# Patient Record
Sex: Female | Born: 1975 | Race: White | Hispanic: No | Smoking: Current every day smoker
Health system: Southern US, Community
[De-identification: ages and names within clinical notes are randomized; demographics above are authoritative.]

## PROBLEM LIST (undated history)

## (undated) ENCOUNTER — Ambulatory Visit (HOSPITAL_BASED_OUTPATIENT_CLINIC_OR_DEPARTMENT_OTHER): Admission: EM | Payer: Self-pay | Source: Home / Self Care

## (undated) ENCOUNTER — Ambulatory Visit (HOSPITAL_BASED_OUTPATIENT_CLINIC_OR_DEPARTMENT_OTHER): Admission: EM | Payer: Medicaid Other | Source: Home / Self Care

## (undated) DIAGNOSIS — E039 Hypothyroidism, unspecified: Secondary | ICD-10-CM

## (undated) DIAGNOSIS — Z8711 Personal history of peptic ulcer disease: Secondary | ICD-10-CM

## (undated) DIAGNOSIS — E785 Hyperlipidemia, unspecified: Secondary | ICD-10-CM

## (undated) DIAGNOSIS — Z720 Tobacco use: Secondary | ICD-10-CM

## (undated) DIAGNOSIS — M1990 Unspecified osteoarthritis, unspecified site: Secondary | ICD-10-CM

## (undated) DIAGNOSIS — J40 Bronchitis, not specified as acute or chronic: Secondary | ICD-10-CM

## (undated) DIAGNOSIS — M5090 Cervical disc disorder, unspecified, unspecified cervical region: Secondary | ICD-10-CM

## (undated) DIAGNOSIS — G479 Sleep disorder, unspecified: Secondary | ICD-10-CM

## (undated) DIAGNOSIS — I10 Essential (primary) hypertension: Secondary | ICD-10-CM

## (undated) DIAGNOSIS — K219 Gastro-esophageal reflux disease without esophagitis: Secondary | ICD-10-CM

## (undated) DIAGNOSIS — K589 Irritable bowel syndrome without diarrhea: Secondary | ICD-10-CM

## (undated) DIAGNOSIS — K279 Peptic ulcer, site unspecified, unspecified as acute or chronic, without hemorrhage or perforation: Secondary | ICD-10-CM

## (undated) DIAGNOSIS — G4700 Insomnia, unspecified: Secondary | ICD-10-CM

## (undated) DIAGNOSIS — J189 Pneumonia, unspecified organism: Secondary | ICD-10-CM

## (undated) DIAGNOSIS — D510 Vitamin B12 deficiency anemia due to intrinsic factor deficiency: Secondary | ICD-10-CM

## (undated) DIAGNOSIS — G8929 Other chronic pain: Secondary | ICD-10-CM

## (undated) DIAGNOSIS — L0292 Furuncle, unspecified: Secondary | ICD-10-CM

## (undated) DIAGNOSIS — R51 Headache: Secondary | ICD-10-CM

## (undated) DIAGNOSIS — R519 Headache, unspecified: Secondary | ICD-10-CM

## (undated) HISTORY — DX: Peptic ulcer, site unspecified, unspecified as acute or chronic, without hemorrhage or perforation: K27.9

## (undated) HISTORY — DX: Irritable bowel syndrome, unspecified: K58.9

## (undated) HISTORY — DX: Morbid (severe) obesity due to excess calories: E66.01

## (undated) HISTORY — PX: COLONOSCOPY: SHX174

## (undated) HISTORY — DX: Unspecified osteoarthritis, unspecified site: M19.90

## (undated) HISTORY — DX: Furuncle, unspecified: L02.92

## (undated) HISTORY — DX: Tobacco use: Z72.0

## (undated) HISTORY — DX: Personal history of peptic ulcer disease: Z87.11

## (undated) HISTORY — DX: Vitamin B12 deficiency anemia due to intrinsic factor deficiency: D51.0

## (undated) HISTORY — DX: Hyperlipidemia, unspecified: E78.5

## (undated) HISTORY — DX: Sleep disorder, unspecified: G47.9

## (undated) HISTORY — DX: Gastro-esophageal reflux disease without esophagitis: K21.9

## (undated) HISTORY — DX: Essential (primary) hypertension: I10

## (undated) HISTORY — DX: Other chronic pain: G89.29

## (undated) HISTORY — PX: CARPAL TUNNEL RELEASE: SHX101

## (undated) HISTORY — DX: Cervical disc disorder, unspecified, unspecified cervical region: M50.90

---

## 2003-05-23 ENCOUNTER — Ambulatory Visit (HOSPITAL_COMMUNITY): Admission: RE | Admit: 2003-05-23 | Discharge: 2003-05-23 | Payer: Self-pay | Admitting: *Deleted

## 2004-12-07 HISTORY — PX: TONSILLECTOMY: SUR1361

## 2005-05-12 ENCOUNTER — Emergency Department (HOSPITAL_COMMUNITY): Admission: EM | Admit: 2005-05-12 | Discharge: 2005-05-12 | Payer: Self-pay | Admitting: Family Medicine

## 2006-09-07 ENCOUNTER — Encounter: Admission: RE | Admit: 2006-09-07 | Discharge: 2006-09-07 | Payer: Self-pay | Admitting: Specialist

## 2008-05-10 ENCOUNTER — Emergency Department (HOSPITAL_COMMUNITY): Admission: EM | Admit: 2008-05-10 | Discharge: 2008-05-11 | Payer: Self-pay | Admitting: Emergency Medicine

## 2008-12-07 HISTORY — PX: MOUTH SURGERY: SHX715

## 2009-02-05 ENCOUNTER — Ambulatory Visit: Payer: Self-pay | Admitting: Internal Medicine

## 2009-02-05 DIAGNOSIS — K279 Peptic ulcer, site unspecified, unspecified as acute or chronic, without hemorrhage or perforation: Secondary | ICD-10-CM | POA: Insufficient documentation

## 2009-02-05 DIAGNOSIS — I10 Essential (primary) hypertension: Secondary | ICD-10-CM | POA: Insufficient documentation

## 2009-02-05 DIAGNOSIS — K219 Gastro-esophageal reflux disease without esophagitis: Secondary | ICD-10-CM

## 2009-02-07 ENCOUNTER — Telehealth: Payer: Self-pay | Admitting: Internal Medicine

## 2009-02-08 LAB — CONVERTED CEMR LAB
AST: 24 units/L (ref 0–37)
Albumin: 3.6 g/dL (ref 3.5–5.2)
Alkaline Phosphatase: 118 units/L — ABNORMAL HIGH (ref 39–117)
BUN: 9 mg/dL (ref 6–23)
Basophils Relative: 0.8 % (ref 0.0–3.0)
Chloride: 107 meq/L (ref 96–112)
Cholesterol: 216 mg/dL (ref 0–200)
Creatinine, Ser: 0.6 mg/dL (ref 0.4–1.2)
Eosinophils Absolute: 0.2 10*3/uL (ref 0.0–0.7)
Eosinophils Relative: 2.5 % (ref 0.0–5.0)
Glucose, Bld: 92 mg/dL (ref 70–99)
HCT: 45.7 % (ref 36.0–46.0)
MCV: 91.4 fL (ref 78.0–100.0)
Monocytes Absolute: 0.6 10*3/uL (ref 0.1–1.0)
Monocytes Relative: 5.8 % (ref 3.0–12.0)
Neutrophils Relative %: 52.6 % (ref 43.0–77.0)
Platelets: 273 10*3/uL (ref 150–400)
Potassium: 3.8 meq/L (ref 3.5–5.1)
RBC: 5 M/uL (ref 3.87–5.11)
Total Protein: 7.2 g/dL (ref 6.0–8.3)
WBC: 9.5 10*3/uL (ref 4.5–10.5)

## 2009-02-15 ENCOUNTER — Ambulatory Visit: Payer: Self-pay | Admitting: Internal Medicine

## 2009-02-15 DIAGNOSIS — L0293 Carbuncle, unspecified: Secondary | ICD-10-CM

## 2009-02-15 DIAGNOSIS — L0292 Furuncle, unspecified: Secondary | ICD-10-CM | POA: Insufficient documentation

## 2009-03-07 ENCOUNTER — Ambulatory Visit: Payer: Self-pay | Admitting: Internal Medicine

## 2009-03-14 ENCOUNTER — Encounter: Payer: Self-pay | Admitting: Internal Medicine

## 2009-03-28 ENCOUNTER — Ambulatory Visit: Payer: Self-pay | Admitting: Internal Medicine

## 2009-06-24 ENCOUNTER — Telehealth: Payer: Self-pay | Admitting: Internal Medicine

## 2009-06-25 ENCOUNTER — Ambulatory Visit: Payer: Self-pay | Admitting: Internal Medicine

## 2009-07-01 ENCOUNTER — Ambulatory Visit: Payer: Self-pay | Admitting: Internal Medicine

## 2009-08-23 ENCOUNTER — Telehealth (INDEPENDENT_AMBULATORY_CARE_PROVIDER_SITE_OTHER): Payer: Self-pay

## 2009-08-23 DIAGNOSIS — K589 Irritable bowel syndrome without diarrhea: Secondary | ICD-10-CM

## 2009-09-20 ENCOUNTER — Encounter: Payer: Self-pay | Admitting: Internal Medicine

## 2010-01-28 ENCOUNTER — Ambulatory Visit: Payer: Self-pay | Admitting: Internal Medicine

## 2010-01-31 ENCOUNTER — Encounter (INDEPENDENT_AMBULATORY_CARE_PROVIDER_SITE_OTHER): Payer: Self-pay | Admitting: *Deleted

## 2010-02-28 ENCOUNTER — Ambulatory Visit: Payer: Self-pay | Admitting: Gastroenterology

## 2010-02-28 DIAGNOSIS — R197 Diarrhea, unspecified: Secondary | ICD-10-CM | POA: Insufficient documentation

## 2010-02-28 DIAGNOSIS — R1084 Generalized abdominal pain: Secondary | ICD-10-CM | POA: Insufficient documentation

## 2010-03-04 ENCOUNTER — Telehealth: Payer: Self-pay | Admitting: Gastroenterology

## 2010-04-10 ENCOUNTER — Ambulatory Visit: Payer: Self-pay | Admitting: Internal Medicine

## 2010-04-10 DIAGNOSIS — G4700 Insomnia, unspecified: Secondary | ICD-10-CM

## 2010-06-30 ENCOUNTER — Telehealth: Payer: Self-pay | Admitting: Internal Medicine

## 2010-07-04 ENCOUNTER — Telehealth (INDEPENDENT_AMBULATORY_CARE_PROVIDER_SITE_OTHER): Payer: Self-pay | Admitting: *Deleted

## 2010-07-10 ENCOUNTER — Ambulatory Visit: Payer: Self-pay | Admitting: Internal Medicine

## 2010-07-10 DIAGNOSIS — M5030 Other cervical disc degeneration, unspecified cervical region: Secondary | ICD-10-CM | POA: Insufficient documentation

## 2010-07-30 ENCOUNTER — Encounter: Payer: Self-pay | Admitting: Internal Medicine

## 2010-09-23 ENCOUNTER — Inpatient Hospital Stay (HOSPITAL_COMMUNITY): Admission: RE | Admit: 2010-09-23 | Discharge: 2010-09-24 | Payer: Self-pay | Admitting: Specialist

## 2010-10-23 ENCOUNTER — Telehealth: Payer: Self-pay | Admitting: Internal Medicine

## 2010-11-13 ENCOUNTER — Encounter
Admission: RE | Admit: 2010-11-13 | Discharge: 2010-11-13 | Payer: Self-pay | Source: Home / Self Care | Admitting: Specialist

## 2010-11-17 ENCOUNTER — Encounter: Payer: Self-pay | Admitting: Internal Medicine

## 2010-11-19 ENCOUNTER — Telehealth: Payer: Self-pay | Admitting: Internal Medicine

## 2010-12-22 DIAGNOSIS — M549 Dorsalgia, unspecified: Secondary | ICD-10-CM | POA: Insufficient documentation

## 2010-12-22 DIAGNOSIS — M542 Cervicalgia: Secondary | ICD-10-CM | POA: Insufficient documentation

## 2010-12-22 DIAGNOSIS — R0789 Other chest pain: Secondary | ICD-10-CM | POA: Insufficient documentation

## 2011-01-06 NOTE — Progress Notes (Signed)
  Phone Note Outgoing Call   Call placed by: Alden Hipp,  July 04, 2010 3:10 PM Call placed to: Patient Summary of Call: Pt NS todays colonoscopy appt, LM with pts mother for her to contact us concerning the reason for missed appt. Pt returned call and said she missed appt because she had to take her husband to the hospital this AM with chest pains. Per Dr Russella Dar do not charge fee.     Appended Document:  Also pt will call when able to R/S appt, may be first of 2012.  Appended Document:  Patient    NOT BILLED.

## 2011-01-06 NOTE — Letter (Signed)
Summary: [facility]  [facility]   Imported By: Maryln Gottron 02/12/2010 12:13:29  _____________________________________________________________________  External Attachment:    Type:   Image     Comment:   External Document

## 2011-01-06 NOTE — Assessment & Plan Note (Signed)
Summary: IBS /GERD/YF   History of Present Illness Visit Type: consult  Primary GI MD: Elie Goody MD [facility] Primary Provider: Eleonore Chiquito, MD Requesting Provider: Eleonore Chiquito, MD Chief Complaint: IBS and GERD.   Pt c/o diarrhea, bloating, and generalized abd pain  History of Present Illness:   This is a 35 year old female who relates a 5-6 year history of intermittent postprandial diarrhea, generalized abdominal pain and abdominal bloating. She clearly notes her symptoms are exacerbated by high-fat foods including red meats. She has loose, watery bowel movements generally following meals about 3 times a day.   GI Review of Systems    Reports abdominal pain and  bloating.     Location of  Abdominal pain: generalized.    Denies acid reflux, belching, chest pain, dysphagia with liquids, dysphagia with solids, heartburn, loss of appetite, nausea, vomiting, vomiting blood, weight loss, and  weight gain.      Reports diarrhea.     Denies anal fissure, black tarry stools, change in bowel habit, constipation, diverticulosis, fecal incontinence, heme positive stool, hemorrhoids, irritable bowel syndrome, jaundice, light color stool, liver problems, rectal bleeding, and  rectal pain.   Current Medications (verified): 1)  Carvedilol 12.5 Mg Tabs (Carvedilol) .... One Tablet By Mouth Two Times A Day 2)  Hydrocodone-Acetaminophen 5-500 Mg Tabs (Hydrocodone-Acetaminophen) .... One or Two Every 6 Hours As Needed For Pain 3)  Nexium 20 Mg Cpdr (Esomeprazole Magnesium) .... One Daily As Needed  Allergies (verified): 1)  ! Biaxin 2)  ! Sulfa 3)  ! Bentyl 4)  ! * Lisinopril  Past History:  Past Medical History: Hypertension morbid obesity Peptic ulcer disease  H. pylori positive 2003 GERD Irritable Bowel Syndrome Cervical disk disease  Past Surgical History: Reviewed history from 02/05/2009 and no changes required. Tonsillectomy  2006  Family History: father age 33,  history of hypertension, restless leg syndrome, obstructive sleep apnea Mother, age 54, history of asthma, hypertension, hypothyroidism No siblings No FH of Colon Cancer: Family History of Colon Polyps:Father   Social History: Occupation:  Education officer, environmental  Married No childern  Patient currently smokes.  Alcohol Use - no Daily Caffeine Use: 6 daily  Illicit Drug Use - no  Review of Systems       The patient complains of back pain.         The pertinent positives and negatives are noted as above and in the HPI. All other ROS were reviewed and were negative.   Vital Signs:  Patient profile:   35 year old female Height:      66 inches Weight:      269 pounds BMI:     43.57 BSA:     2.27 Pulse rate:   96 / minute Pulse rhythm:   regular BP sitting:   122 / 80  (left arm) Cuff size:   large  Vitals Entered By: Ok Anis CMA (February 28, 2010 10:12 AM)  Physical Exam  General:  Well developed, well nourished, no acute distress. obese.   Head:  Normocephalic and atraumatic. Eyes:  PERRLA, no icterus. Ears:  Normal auditory acuity. Mouth:  No deformity or lesions, dentition normal. Chest Wall:  Symmetrical;  no deformities or tenderness. Lungs:  Clear throughout to auscultation. Heart:  Regular rate and rhythm; no murmurs, rubs,  or bruits. Abdomen:  Soft, nontender and nondistended. No masses, hepatosplenomegaly or hernias noted. Normal bowel sounds. Rectal:  deferred until time of colonoscopy.   Msk:  Symmetrical with no gross  deformities. Normal posture. Pulses:  Normal pulses noted. Extremities:  No clubbing, cyanosis, edema or deformities noted. Neurologic:  Alert and  oriented x4;  grossly normal neurologically. Skin:  Intact without significant lesions or rashes. Cervical Nodes:  No significant cervical adenopathy. Axillary Nodes:  No significant axillary adenopathy. Inguinal Nodes:  No significant inguinal adenopathy. Psych:  Alert and cooperative. anxious.     Impression & Recommendations:  Problem # 1:  DIARRHEA (ICD-787.91) Diarrhea associated with generalized abdominal pain and bloating. I suspect she has diarrhea, predominant irritable bowel syndrome. Rule out celiac disease and inflammatory bowel disease. The risks, benefits and alternatives to colonoscopy with possible biopsy and possible polypectomy were discussed with the patient and they consent to proceed. The procedure will be scheduled electively.  Orders: Colonoscopy (Colon) TLB-IgA (Immunoglobulin A) (82784-IGA) T-Sprue Panel (Celiac Disease Aby Eval) (83516x3/[address])  Problem # 2:  ABDOMINAL PAIN, GENERALIZED (ICD-789.07) As in problem #1. Begin a low gas diet, and Gas-X q.i.d. p.r.n. Begin anti-spasmodic. Consider a course of Xifaxan following colonoscopy.  Problem # 3:  GERD (ICD-530.81) Continue standard antireflux measures and Nexium daily.  Patient Instructions: 1)  Get your labs drawn today in the basement. 2)  Pick up your prescriptions from your pharmacy.  3)  Start Align one tablet by mouth once daily x 1 month. 4)  Start Gas- X four times a day as needed. 5)  Excessive Gas Diet handout given.  6)  Colonoscopy brochure given.  7)  Copy sent to : Eleonore Chiquito, MD 8)  The medication list was reviewed and reconciled.  All changed / newly prescribed medications were explained.  A complete medication list was provided to the patient / caregiver.  Prescriptions: MOVIPREP 100 GM  SOLR (PEG-KCL-NACL-NASULF-NA ASC-C) As per prep instructions.  #1 x 0   Entered by:   Christie Nottingham CMA (AAMA)   Authorized by:   Meryl Dare MD [facility]   Signed by:   Meryl Dare MD FACG on 02/28/2010   Method used:   Electronically to        [facility]  [address] [id number]       [address]       [place], [place]  [address]       Ph: [phone] or [phone]       Fax: [phone]   RxID:   [id number] GLYCOPYRROLATE 2 MG TABS  (GLYCOPYRROLATE) one tablet by mouth two times a day  #60 x 11   Entered by:   Christie Nottingham CMA (AAMA)   Authorized by:   Meryl Dare MD [facility]   Signed by:   Meryl Dare MD FACG on 02/28/2010   Method used:   [facility]        [facility]  [address] [id number]       [address]       [place], [place]  [address]       Ph: [phone] or [phone]       Fax: [phone]   RxID:   [id number]

## 2011-01-06 NOTE — Assessment & Plan Note (Signed)
Summary: BOIL ON BREAST/PS   Vital Signs:  Patient profile:   35 year old Julie [name] Weight:      267 pounds Temp:     98.4 degrees F oral BP sitting:   118 / 70  (left arm) Cuff size:   large  Vitals Entered By: Julie Brady LPN (Apr 10, 6044 10:22 AM) CC: c/o (R) under breat 'boil' and also (R) arm pit area Is Patient Diabetic? No   Primary Care Provider:  Eleonore Chiquito, MD  CC:  c/o (R) under breat 'boil' and also (R) arm pit area.  History of Present Illness: 35 year old patient who is seen today complaining of recurrence of skin and soft tissue infections.  He said then that problem intermittently over the years, and occur underneath the breasts axilla and groin regions.  She has had the multiple small, furuncles  involving primarily the right breast and right axilla.  There's been no fever. she also works a third shift and complain of some work shift  insomnia issues  Preventive Screening-Counseling & Management  Alcohol-Tobacco     Smoking Status: current  Allergies: 1)  ! Biaxin 2)  ! Sulfa 3)  ! Bentyl 4)  ! * Lisinopril  Past History:  Past Medical History: Hypertension morbid obesity Peptic ulcer disease  H. pylori positive 2003 GERD Irritable Bowel Syndrome Cervical disk disease recurrent furunculus   Physical Exam  General:  overweight-appearing.  normal blood pressureoverweight-appearing.   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 One at bedtime as needed for sleep  Her updated medication list for this problem includes:    Zolpidem Tartrate 10 Mg Tabs (Zolpidem tartrate) ..... One at bedtime as needed for  sleep  Complete Medication List: 1)  Carvedilol 12.5 Mg Tabs (Carvedilol) .... One tablet by mouth two times a day 2)  Hydrocodone-acetaminophen 5-500 Mg Tabs (Hydrocodone-acetaminophen) .... One or two every 6 hours as needed for pain 3)  Nexium 20 Mg Cpdr (Esomeprazole magnesium) .... One daily as needed 4)  Doxycycline Hyclate 100 Mg Caps (Doxycycline hyclate) .... One twice daily 5)  Zolpidem Tartrate 10 Mg Tabs (Zolpidem tartrate) .... One at bedtime as needed for sleep  Patient Instructions: 1)  Please schedule a follow-up appointment as needed. 2)  Take your antibiotic as prescribed until ALL of it is gone, but stop if you develop a rash or swelling and contact our office as soon as possible. Prescriptions: ZOLPIDEM TARTRATE 10 MG TABS (ZOLPIDEM TARTRATE) one at bedtime as needed for sleep  #30 x 0   Entered and Authorized by:   Gordy Savers  MD   Signed by:   Gordy Savers  MD on 04/10/2010   Method used:   Print then Give to Patient   RxID:   [id number] DOXYCYCLINE HYCLATE 100 MG CAPS (DOXYCYCLINE HYCLATE) one twice daily  #50 x 2   Entered and Authorized by:   Gordy Savers  MD   Signed by:   Gordy Savers  MD on 04/10/2010   Method used:   Print then Give to Patient   RxID:   [id number]

## 2011-01-06 NOTE — Progress Notes (Signed)
Summary: c/o heart racing , no chest pain or SOB  Phone Note Call from Patient   Caller: Patient Reason for Call: Talk to Nurse Summary of Call: took live call - just had neck surgery - just finished prednisone - feels like heart is racing , no chest pain , no SOB , c/o faitgue . Has f/u appt this morning with surgeon. Encourage to keep that appt - if they feel there is any irregulartiy in HR or Bp that we need to address - call and we will see today. Verbalized understanding .  to Dr. Amador Cunas to be aware. KIK  Initial call taken by: Duard Brady LPN,  October 23, 2010 11:57 AM  Follow-up for Phone Call        noted Follow-up by: Gordy Savers  MD,  October 23, 2010 12:46 PM

## 2011-01-06 NOTE — Letter (Signed)
Summary: [facility]   Imported By: Maryln Gottron 08/08/2010 15:43:37  _____________________________________________________________________  External Attachment:    Type:   Image     Comment:   External Document

## 2011-01-06 NOTE — Assessment & Plan Note (Signed)
Summary: ABD PAIN/NJR   Vital Signs:  Patient profile:   35 year old female Weight:      270 pounds Temp:     98.1 degrees F oral BP sitting:   120 / 80  (left arm) Cuff size:   large  Vitals Entered By: Duard Brady LPN (January 28, 2010 10:30 AM) CC: c/o upper abd pain, SOB , pain/bloating/diarrhea past po meat and fried foods, GI hx hpylori and peptic ulcer  Is Patient Diabetic? No   CC:  c/o upper abd pain, SOB , pain/bloating/diarrhea past po meat and fried foods, and GI hx hpylori and peptic ulcer .  History of Present Illness:  35 year old patient who presents with a 4-week history of worsening abdominal pain, bloating.  She states that the pain is worse postprandially.  Apparently in July of last year at a local emergency room had an abdominal ultrasound performed; this was inconclusive due to considerable bowel gas.  There has been no history of gallbladder disease.  She does have a history of treated H. pylori peptic ulcer disease and states that her last upper endoscopy was in 2004 or 2005.  She has been followed by GI in [place], but wishes to see a local gastroenterologist.  She also describes diarrhea alternating with constipation.  Since her last visit here.  Her weight is down 23 pounds.  She has been using Zantac.  She has treated hypertension, which has been stable.  She has a history of gastroesophageal  reflux disease, as well as IBS.   Preventive Screening-Counseling & Management  Alcohol-Tobacco     Smoking Status: current  Allergies (verified): No Known Drug Allergies  Past History:  Past Medical History: Hypertension morbid obesity Peptic ulcer disease  H. pylori positive 2003 GERD  Past Surgical History: Reviewed history from 02/05/2009 and no changes required. Tonsillectomy  2006  Family History: Reviewed history from 02/05/2009 and no changes required. father age 9, history of hypertension, restless leg syndrome, obstructive sleep  apnea  Mother, age 56, history of asthma, hypertension, hypothyroidism  No siblings  Social History: Reviewed history from 02/05/2009 and no changes required. 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  The patient denies anorexia, fever, weight gain, vision loss, decreased hearing, hoarseness, chest pain, syncope, dyspnea on exertion, peripheral edema, prolonged cough, headaches, hemoptysis, melena, hematochezia, severe indigestion/heartburn, hematuria, incontinence, genital sores, muscle weakness, suspicious skin lesions, transient blindness, difficulty walking, depression, unusual weight change, abnormal bleeding, enlarged lymph nodes, angioedema, and breast masses.    Physical Exam  General:  overweight-appearing.  normal blood pressureoverweight-appearing.   Head:  Normocephalic and atraumatic without obvious abnormalities. No apparent alopecia or balding. Eyes:  No corneal or conjunctival inflammation noted. EOMI. Perrla. Funduscopic exam benign, without hemorrhages, exudates or papilledema. Vision grossly normal. Mouth:  Oral mucosa and oropharynx without lesions or exudates.  Teeth in good repair. Neck:  No deformities, masses, or tenderness noted. Lungs:  Normal respiratory effort, chest expands symmetrically. Lungs are clear to auscultation, no crackles or wheezes. Heart:  Normal rate and regular rhythm. S1 and S2 normal without gallop, murmur, click, rub or other extra sounds. Abdomen:  epigastric tenderness noted; she also had milder tenderness in lower quadrants; bowel sounds active.  No guarding or rebound Msk:  No deformity or scoliosis noted of thoracic or lumbar spine.   Pulses:  R and L carotid,radial,femoral,dorsalis pedis and posterior  tibial pulses are full and equal bilaterally Extremities:  No clubbing, cyanosis, edema, or deformity noted with normal full range of motion of  all joints.     Impression & Recommendations:  Problem # 1:  IRRITABLE BOWEL SYNDROME (ICD-564.1)  Orders: Gastroenterology Referral (GI)  Problem # 2:  GERD (ICD-530.81)  Will refer to GI and placed on PPI therapy  Her updated medication list for this problem includes:    Nexium 20 Mg Cpdr (Esomeprazole magnesium) ..... One daily  Orders: Gastroenterology Referral (GI)  Problem # 3:  HYPERTENSION (ICD-401.9)  Complete Medication List: 1)  Carvedilol 12.5mg  Tabs (carvedilol)  .Marland Kitchen.. 1 two times a day 2)  Hydrocodone-acetaminophen 5-500 Mg Tabs (Hydrocodone-acetaminophen) .... One or two every 6 hours as needed for pain 3)  Nexium 20 Mg Cpdr (Esomeprazole magnesium) .... One daily  Patient Instructions: 1)  Limit your Sodium (Salt). 2)  Avoid foods high in acid (tomatoes, citrus juices, spicy foods). Avoid eating within two hours of lying down or before exercising. Do not over eat; try smaller more frequent meals. Elevate head of bed twelve inches when sleeping. 3)  It is important that you exercise regularly at least 20 minutes 5 times a week. If you develop chest pain, have severe difficulty breathing, or feel very tired , stop exercising immediately and seek medical attention. 4)  You need to lose weight. Consider a lower calorie diet and regular exercise.  Prescriptions: NEXIUM 20 MG CPDR (ESOMEPRAZOLE MAGNESIUM) one daily  #90 x 0   Entered and Authorized by:   Gordy Savers  MD   Signed by:   Gordy Savers  MD on 01/28/2010   Method used:   Print then Give to Patient   RxID:   [id number] HYDROCODONE-ACETAMINOPHEN 5-500 MG TABS (HYDROCODONE-ACETAMINOPHEN) one or two every 6 hours as needed for pain  #50 x 2   Entered and Authorized by:   Gordy Savers  MD   Signed by:   Gordy Savers  MD on 01/28/2010   Method used:   Print then Give to Patient   RxID:   [id number] CARVEDILOL  12.5MG  TABS (CARVEDILOL) 1 two times a day  #180 x 3    Entered and Authorized by:   Gordy Savers  MD   Signed by:   Gordy Savers  MD on 01/28/2010   Method used:   Print then Give to Patient   RxID:   [id number]

## 2011-01-06 NOTE — Letter (Signed)
Summary: [facility] Instructions  [facility]  [address] [place], [place] [address]   Phone: [phone]  Fax: [phone]       Julie [name]    February 28, 1976    MRN: [id number]        Procedure Day /Date: Friday May 6th, 2011     Arrival Time: 12:30pm     Procedure Time: 1:30pm     Location of Procedure:                    _x _  [facility] (4th Floor)                        PREPARATION FOR COLONOSCOPY WITH MOVIPREP   Starting 5 days prior to your procedure 04/06/10  do not eat nuts, seeds, popcorn, corn, beans, peas,  salads, or any raw vegetables.  Do not take any fiber supplements (e.g. Metamucil, Citrucel, and Benefiber).  THE DAY BEFORE YOUR PROCEDURE         DATE:  04/10/10  DAY:  Thursday   1.  Drink clear liquids the entire day-NO SOLID FOOD  2.  Do not drink anything colored red or purple.  Avoid juices with pulp.  No orange juice.  3.  Drink at least 64 oz. (8 glasses) of fluid/clear liquids during the day to prevent dehydration and help the prep work efficiently.  CLEAR LIQUIDS INCLUDE: Water Jello Ice Popsicles Tea (sugar ok, no milk/cream) Powdered fruit flavored drinks Coffee (sugar ok, no milk/cream) Gatorade Juice: apple, white grape, white cranberry  Lemonade Clear bullion, consomm, broth Carbonated beverages (any kind) Strained chicken noodle soup Hard Candy                             4.  In the morning, mix first dose of MoviPrep solution:    Empty 1 Pouch A and 1 Pouch B into the disposable container    Add lukewarm drinking water to the top line of the container. Mix to dissolve    Refrigerate (mixed solution should be used within 24 hrs)  5.  Begin drinking the prep at 5:00 p.m. The MoviPrep container is divided by 4 marks.   Every 15 minutes drink the solution down to the next mark (approximately 8 oz) until the full liter is complete.   6.  Follow completed prep with 16 oz of clear liquid of your choice  (Nothing red or purple).  Continue to drink clear liquids until bedtime.  7.  Before going to bed, mix second dose of MoviPrep solution:    Empty 1 Pouch A and 1 Pouch B into the disposable container    Add lukewarm drinking water to the top line of the container. Mix to dissolve    Refrigerate  THE DAY OF YOUR PROCEDURE      DATE:  04/11/10  DAY: Friday   Beginning at   8:30a.m. (5 hours before procedure):         1. Every 15 minutes, drink the solution down to the next mark (approx 8 oz) until the full liter is complete.  2. Follow completed prep with 16 oz. of clear liquid of your choice.    3. You may drink clear liquids until 11:30am   (2 HOURS BEFORE PROCEDURE).   MEDICATION INSTRUCTIONS  Unless otherwise instructed, you should take regular prescription medications with a small sip of water  as early as possible the morning of your procedure.         OTHER INSTRUCTIONS  You will need a responsible adult at least 33 years of age to accompany you and drive you home.   This person must remain in the waiting room during your procedure.  Wear loose fitting clothing that is easily removed.  Leave jewelry and other valuables at home.  However, you may wish to bring a book to read or  an iPod/MP3 player to listen to music as you wait for your procedure to start.  Remove all body piercing jewelry and leave at home.  Total time from sign-in until discharge is approximately 2-3 hours.  You should go home directly after your procedure and rest.  You can resume normal activities the  day after your procedure.  The day of your procedure you should not:   Drive   Make legal decisions   Operate machinery   Drink alcohol   Return to work  You will receive specific instructions about eating, activities and medications before you leave.    The above instructions have been reviewed and explained to me by   Marchelle Folks.    I fully understand and can verbalize these  instructions _____________________________ Date _________

## 2011-01-06 NOTE — Progress Notes (Signed)
Summary: No call No show 6 mos rov  Phone Note Outgoing Call   Call placed by: Duard Brady LPN,  June 30, 2010 8:52 AM Call placed to: Patient Summary of Call: No Call No Show - 6 mos rov - .. attempt to call - ans mach - LMTCB .  KIK Initial call taken by: Duard Brady LPN,  June 30, 2010 8:53 AM

## 2011-01-06 NOTE — Letter (Signed)
Summary: New Patient letter  [facility]  [address] [place], [place] [address]   Phone: [phone]  Fax: [phone]       01/31/2010 MRN: [id number]  Julie [name] [address] [place], [place]  [address]  Dear Ms. Julie [name],  Welcome to the Gastroenterology Division at [facility].    You are scheduled to see Dr.  Russella Dar on 02-28-10 at 10:15am on the 3rd floor at [facility], [address]  We ask that you try to arrive at our office 15 minutes prior to your appointment time to allow for check-in.  We would like you to complete the enclosed self-administered evaluation form prior to your visit and bring it with you on the day of your appointment.  We will review it with you.  Also, please bring a complete list of all your medications or, if you prefer, bring the medication bottles and we will list them.  Please bring your insurance card so that we may make a copy of it.  If your insurance requires a referral to see a specialist, please bring your referral form from your primary care physician.  Co-payments are due at the time of your visit and may be paid by cash, check or credit card.     Your office visit will consist of a consult with your physician (includes a physical exam), any laboratory testing he/she may order, scheduling of any necessary diagnostic testing (e.g. x-ray, ultrasound, CT-scan), and scheduling of a procedure (e.g. Endoscopy, Colonoscopy) if required.  Please allow enough time on your schedule to allow for any/all of these possibilities.    If you cannot keep your appointment, please call [phone] to cancel or reschedule prior to your appointment date.  This allows [place] the opportunity to schedule an appointment for another patient in need of care.  If you do not cancel or reschedule by 5 p.m. the business day prior to your appointment date, you will be charged a $50.00 late cancellation/no-show fee.    Thank you for  choosing  Gastroenterology for your medical needs.  We appreciate the opportunity to care for you.  Please visit [place] at our website  to learn more about our practice.                     Sincerely,                                                             The Gastroenterology Division

## 2011-01-06 NOTE — Progress Notes (Signed)
Summary: ? re meds  Phone Note Call from Patient Call back at Home Phone [phone]   Caller: Patient Call For: Russella Dar Reason for Call: Talk to Nurse Summary of Call: Patient has questons regarding medication that Dr Russella Dar prescribed. (please call asap because she works night shift and she just got home) Initial call taken by: Tawni Levy,  March 04, 2010 8:58 AM  Follow-up for Phone Call        Pt states the glyccopyrolate medication she started several days ago is causing pt to have some abdominal discomfort, dry mouth and she states it is the same feeling she has when she takes sulfa medications that she is allergic to. I informed pt that she should decrease to 1/2 tablet by mouth two times a day and see if this helps with some of the side effects. If this does not help or she has a increase in symptoms to discontinue the medicine and to call us back by Friday. Follow-up by: Christie Nottingham CMA Duncan Dull),  March 04, 2010 9:10 AM

## 2011-01-06 NOTE — Assessment & Plan Note (Signed)
Summary: to discuss ortho surgery per dr k//ccm   Vital Signs:  Patient profile:   35 year old female Height:      66 inches Weight:      274 pounds BMI:     44.38 Temp:     98.1 degrees F oral Pulse rate:   80 / minute Pulse rhythm:   regular Resp:     16 per minute BP sitting:   130 / 74  (right arm) Cuff size:   large  Vitals Entered By: Duard Brady LPN (July 10, 2010 8:28 AM) CC: surg clearance - ortho Is Patient Diabetic? No   Primary Care Provider:  Eleonore Chiquito, MD  CC:  surg clearance - ortho.  History of Present Illness:  35 year old patient who is scheduled for cervical spine surgery in October for a herniated disk.  She has had chronic neck pain for years and has been on chronic narcotics.  Medically.  She has been quite stable.  She does have treated hypertension, which has been well-controlled.  She has been on chronic narcotics.  She does have a history of peptic ulcer disease exogenous  obesity.  There is also a history of occasional furunculus  which presently is stable.  She denies any cardiopulmonary complaints  Preventive Screening-Counseling & Management  Alcohol-Tobacco     Smoking Cessation Counseling: yes  Allergies: 1)  ! Biaxin 2)  ! Sulfa 3)  ! Bentyl 4)  ! * Lisinopril  Past History:  Past Medical History: Reviewed history from 04/10/2010 and no changes required. Hypertension morbid obesity Peptic ulcer disease  H. pylori positive 2003 GERD Irritable Bowel Syndrome Cervical disk disease recurrent furunculus   Past Surgical History: Reviewed history from 02/05/2009 and no changes required. Tonsillectomy  2006  Family History: Reviewed history from 02/28/2010 and no changes required. father age 32, history of hypertension, restless leg syndrome, obstructive sleep apnea Mother, age 75, history of asthma, hypertension, hypothyroidism No siblings No FH of Colon Cancer: Family History of Colon Polyps:Father   Social  History: Reviewed history from 02/28/2010 and no changes required. Occupation:  Education officer, environmental  Married No childern  Patient currently smokes.  Alcohol Use - no Daily Caffeine Use: 6 daily  Illicit Drug Use - no  Review of Systems  The patient denies anorexia, fever, weight loss, weight gain, vision loss, decreased hearing, hoarseness, chest pain, syncope, dyspnea on exertion, peripheral edema, prolonged cough, headaches, hemoptysis, abdominal pain, melena, hematochezia, severe indigestion/heartburn, hematuria, incontinence, genital sores, muscle weakness, suspicious skin lesions, transient blindness, difficulty walking, depression, unusual weight change, abnormal bleeding, enlarged lymph nodes, angioedema, and breast masses.    Physical Exam  General:  overweight-appearing.  124/74overweight-appearing.   Head:  Normocephalic and atraumatic without obvious abnormalities. No apparent alopecia or balding. Eyes:  No corneal or conjunctival inflammation noted. EOMI. Perrla. Funduscopic exam benign, without hemorrhages, exudates or papilledema. Vision grossly normal. Mouth:  Oral mucosa and oropharynx without lesions or exudates.  Teeth in good repair. Neck:  No deformities, masses, or tenderness noted. Lungs:  Normal respiratory effort, chest expands symmetrically. Lungs are clear to auscultation, no crackles or wheezes. Heart:  Normal rate and regular rhythm. S1 and S2 normal without gallop, murmur, click, rub or other extra sounds. Abdomen:  Bowel sounds positive,abdomen soft and non-tender without masses, organomegaly or hernias noted. Msk:  No deformity or scoliosis noted of thoracic or lumbar spine.   Pulses:  R and L carotid,radial,femoral,dorsalis pedis and posterior tibial pulses are full and  equal bilaterally Extremities:  No clubbing, cyanosis, edema, or deformity noted with normal full range of motion of all joints.     Impression & Recommendations:  Problem # 1:  PEPTIC ULCER  DISEASE (ICD-533.90)  Her updated medication list for this problem includes:    Nexium 20 Mg Cpdr (Esomeprazole magnesium) ..... One daily as needed  Her updated medication list for this problem includes:    Nexium 20 Mg Cpdr (Esomeprazole magnesium) ..... One daily as needed  Problem # 2:  HYPERTENSION (ICD-401.9)  Her updated medication list for this problem includes:    Carvedilol 12.5 Mg Tabs (Carvedilol) ..... One tablet by mouth two times a day  Her updated medication list for this problem includes:    Carvedilol 12.5 Mg Tabs (Carvedilol) ..... One tablet by mouth two times a day  Problem # 3:  DISC DISEASE, CERVICAL (ICD-722.4)  Complete Medication List: 1)  Carvedilol 12.5 Mg Tabs (Carvedilol) .... One tablet by mouth two times a day 2)  Nexium 20 Mg Cpdr (Esomeprazole magnesium) .... One daily as needed 3)  Zolpidem Tartrate 10 Mg Tabs (Zolpidem tartrate) .... One at bedtime as needed for sleep 4)  Hydrocodone-acetaminophen 7.5-325 Mg Tabs (Hydrocodone-acetaminophen)  Patient Instructions: 1)  Limit your Sodium (Salt) to less than 2 grams a day(slightly less than 1/2 a teaspoon) to prevent fluid retention, swelling, or worsening of symptoms. 2)  Tobacco is very bad for your health and your loved ones! You Should stop smoking!. 3)  It is important that you exercise regularly at least 20 minutes 5 times a week. If you develop chest pain, have severe difficulty breathing, or feel very tired , stop exercising immediately and seek medical attention. 4)  You need to lose weight. Consider a lower calorie diet and regular exercise.  5)  Check your Blood Pressure regularly. If it is above:  150/90 you should make an appointment. Prescriptions: ZOLPIDEM TARTRATE 10 MG TABS (ZOLPIDEM TARTRATE) one at bedtime as needed for sleep  #30 x 2   Entered and Authorized by:   Gordy Savers  MD   Signed by:   Gordy Savers  MD on 07/10/2010   Method used:   Print then Give to  Patient   RxID:   [id number] NEXIUM 20 MG CPDR (ESOMEPRAZOLE MAGNESIUM) one daily as needed  #90 x 4   Entered and Authorized by:   Gordy Savers  MD   Signed by:   Gordy Savers  MD on 07/10/2010   Method used:   Print then Give to Patient   RxID:   [id number] CARVEDILOL 12.5 MG TABS (CARVEDILOL) one tablet by mouth two times a day  #180 x 4   Entered and Authorized by:   Gordy Savers  MD   Signed by:   Gordy Savers  MD on 07/10/2010   Method used:   Print then Give to Patient   RxID:   [id number]   Appended Document: to discuss ortho surgery per dr k//ccm     Allergies: 1)  ! Biaxin 2)  ! Sulfa 3)  ! Bentyl 4)  ! * Lisinopril   Complete Medication List: 1)  Carvedilol 12.5 Mg Tabs (Carvedilol) .... One tablet by mouth two times a day 2)  Nexium 20 Mg Cpdr (Esomeprazole magnesium) .... One daily as needed 3)  Zolpidem Tartrate 10 Mg Tabs (Zolpidem tartrate) .... One at bedtime as needed for sleep 4)  Hydrocodone-acetaminophen 7.5-325 Mg Tabs (Hydrocodone-acetaminophen)

## 2011-01-06 NOTE — Letter (Signed)
Summary: Request for Pre-Op Clearance/[facility]  Request for Pre-Op Clearance/[facility]   Imported By: Maryln Gottron 07/14/2010 15:12:50  _____________________________________________________________________  External Attachment:    Type:   Image     Comment:   External Document

## 2011-01-06 NOTE — Miscellaneous (Signed)
Summary: Controlled Substances Contract  Controlled Substances Contract   Imported By: Maryln Gottron 07/11/2010 14:26:54  _____________________________________________________________________  External Attachment:    Type:   Image     Comment:   External Document

## 2011-01-08 NOTE — Progress Notes (Signed)
Summary: refill ambien  Phone Note Refill Request Message from:  Fax from Pharmacy on November 19, 2010 11:07 AM  Refills Requested: Medication #1:  ZOLPIDEM TARTRATE 10 MG TABS one at bedtime as needed for sleep   Last Refilled: 10/10/2010 Jani Files   Method Requested: Fax to Local Pharmacy Initial call taken by: Duard Brady LPN,  November 19, 2010 11:08 AM    Prescriptions: ZOLPIDEM TARTRATE 10 MG TABS (ZOLPIDEM TARTRATE) one at bedtime as needed for sleep  #30 x 1   Entered by:   Duard Brady LPN   Authorized by:   Gordy Savers  MD   Signed by:   Duard Brady LPN on 29/52/8413   Method used:   Historical   RxID:   [id number]  faxed back to Jani Files   kik

## 2011-01-08 NOTE — Letter (Signed)
Summary: [facility]   Imported By: Maryln Gottron 12/03/2010 11:12:20  _____________________________________________________________________  External Attachment:    Type:   Image     Comment:   External Document

## 2011-02-18 LAB — COMPREHENSIVE METABOLIC PANEL
ALT: 29 U/L (ref 0–35)
AST: 24 U/L (ref 0–37)
Alkaline Phosphatase: 86 U/L (ref 39–117)
CO2: 27 mEq/L (ref 19–32)
Chloride: 105 mEq/L (ref 96–112)
GFR calc Af Amer: >60 mL/min (ref >60)
GFR calc non Af Amer: >60 mL/min (ref >60)
Glucose, Bld: 82 mg/dL (ref 70–99)
Potassium: 3.7 mEq/L (ref 3.5–5.1)
Sodium: 138 mEq/L (ref 135–145)

## 2011-02-18 LAB — CBC
HCT: 45.8 % (ref 36.0–46.0)
Hemoglobin: 15.8 g/dL — ABNORMAL HIGH (ref 12.0–15.0)
RBC: 5.08 MIL/uL (ref 3.87–5.11)
WBC: 11.3 10*3/uL — ABNORMAL HIGH (ref 4.0–10.5)

## 2011-02-18 LAB — URINALYSIS, ROUTINE W REFLEX MICROSCOPIC
Bilirubin Urine: NEGATIVE
Glucose, UA: NEGATIVE mg/dL
Nitrite: NEGATIVE
Specific Gravity, Urine: 1.005 (ref 1.005–1.030)
pH: 7.5 (ref 5.0–8.0)

## 2011-02-18 LAB — SURGICAL PCR SCREEN
MRSA, PCR: NEGATIVE
Staphylococcus aureus: POSITIVE — AB

## 2011-02-18 LAB — PROTIME-INR: INR: 0.91 (ref 0.00–1.49)

## 2011-07-24 ENCOUNTER — Other Ambulatory Visit: Payer: Self-pay | Admitting: Internal Medicine

## 2011-09-03 LAB — POCT I-STAT, CHEM 8
Calcium, Ion: 1.16
Creatinine, Ser: 0.9
Glucose, Bld: 99
HCT: 47 — ABNORMAL HIGH
Hemoglobin: 16 — ABNORMAL HIGH
TCO2: 29

## 2011-09-03 LAB — URINALYSIS, ROUTINE W REFLEX MICROSCOPIC
Bilirubin Urine: NEGATIVE
Hgb urine dipstick: NEGATIVE
Ketones, ur: NEGATIVE
Nitrite: NEGATIVE
Protein, ur: NEGATIVE
Urobilinogen, UA: 0.2

## 2011-09-03 LAB — DIFFERENTIAL
Basophils Absolute: 0.1
Basophils Relative: 1
Eosinophils Absolute: 0.2
Eosinophils Relative: 2

## 2011-09-03 LAB — COMPREHENSIVE METABOLIC PANEL
ALT: 27
AST: 21
CO2: 27
Chloride: 103
GFR calc Af Amer: >60
GFR calc non Af Amer: >60
Sodium: 137
Total Bilirubin: 0.7

## 2011-09-03 LAB — LIPASE, BLOOD: Lipase: 18

## 2011-09-03 LAB — CBC
Hemoglobin: 15.3 — ABNORMAL HIGH
MCV: 90.6
RBC: 5.01
WBC: 11.1 — ABNORMAL HIGH

## 2011-09-24 ENCOUNTER — Other Ambulatory Visit: Payer: Self-pay | Admitting: Internal Medicine

## 2011-10-22 ENCOUNTER — Other Ambulatory Visit: Payer: Self-pay | Admitting: Orthopaedic Surgery

## 2011-10-22 DIAGNOSIS — M542 Cervicalgia: Secondary | ICD-10-CM

## 2011-10-26 ENCOUNTER — Ambulatory Visit
Admission: RE | Admit: 2011-10-26 | Discharge: 2011-10-26 | Disposition: A | Discharge status: Home / Self Care | Payer: BC Managed Care – PPO | Source: Ambulatory Visit | Attending: Orthopaedic Surgery | Admitting: Orthopaedic Surgery

## 2011-10-26 ENCOUNTER — Other Ambulatory Visit: Payer: Self-pay | Admitting: Internal Medicine

## 2011-10-26 DIAGNOSIS — M542 Cervicalgia: Secondary | ICD-10-CM

## 2011-12-22 ENCOUNTER — Other Ambulatory Visit: Payer: Self-pay

## 2011-12-22 MED ORDER — CARVEDILOL 12.5 MG PO TABS: 12.5000 mg | ORAL_TABLET | Freq: Two times a day (BID) | ORAL | Status: DC

## 2011-12-29 ENCOUNTER — Telehealth: Payer: Self-pay | Admitting: Internal Medicine

## 2011-12-29 MED ORDER — CARVEDILOL 12.5 MG PO TABS: 12.5000 mg | ORAL_TABLET | Freq: Two times a day (BID) | ORAL | Status: DC

## 2011-12-29 NOTE — Telephone Encounter (Signed)
Rx sent to pharmacy   

## 2011-12-29 NOTE — Telephone Encounter (Signed)
Pt made an appt for 01-08-2012. [facility] [facility] [place]. Patient stated that she is out and cannont come in until then. Thanks.

## 2012-01-08 ENCOUNTER — Ambulatory Visit (INDEPENDENT_AMBULATORY_CARE_PROVIDER_SITE_OTHER): Payer: BC Managed Care – PPO | Admitting: Internal Medicine

## 2012-01-08 ENCOUNTER — Encounter: Payer: Self-pay | Admitting: Internal Medicine

## 2012-01-08 DIAGNOSIS — K279 Peptic ulcer, site unspecified, unspecified as acute or chronic, without hemorrhage or perforation: Secondary | ICD-10-CM

## 2012-01-08 DIAGNOSIS — N390 Urinary tract infection, site not specified: Secondary | ICD-10-CM

## 2012-01-08 DIAGNOSIS — I10 Essential (primary) hypertension: Secondary | ICD-10-CM

## 2012-01-08 DIAGNOSIS — M5030 Other cervical disc degeneration, unspecified cervical region: Secondary | ICD-10-CM

## 2012-01-08 LAB — POCT URINALYSIS DIPSTICK
Ketones, UA: NEGATIVE
Protein, UA: NEGATIVE
Spec Grav, UA: 1.015
pH, UA: 6.5

## 2012-01-08 NOTE — Progress Notes (Signed)
  Subjective:    Patient ID: Julie [name], female    DOB: 10/18/1976, 36 y.o.   MRN: [id number]  HPI  36 year old patient who is seen today for followup. She has a history of treated hypertension. She also has a history  of exogenous obesity. She is followed by chronic pain management date 2 Rx cervical pain more recently she has had some chronic low back pain as well she is on Neurontin and she has also tapered her narcotic use she is still on Percocet but is now off OxyContin. She feels that the low back pain perhaps is chronic and was masked by higher narcotic dose. She has a history of H. pylori and peptic ulcer disease and anti-inflammatory medications are not well-tolerated    Review of Systems  Constitutional: Negative.   HENT: Negative for hearing loss, congestion, sore throat, rhinorrhea, dental problem, sinus pressure and tinnitus.   Eyes: Negative for pain, discharge and visual disturbance.  Respiratory: Negative for cough and shortness of breath.   Cardiovascular: Negative for chest pain, palpitations and leg swelling.  Gastrointestinal: Negative for nausea, vomiting, abdominal pain, diarrhea, constipation, blood in stool and abdominal distention.  Genitourinary: Negative for dysuria, urgency, frequency, hematuria, flank pain, vaginal bleeding, vaginal discharge, difficulty urinating, vaginal pain and pelvic pain.  Musculoskeletal: Positive for back pain (cervical and lumbar pain) and arthralgias. Negative for joint swelling and gait problem.  Skin: Negative for rash.  Neurological: Negative for dizziness, syncope, speech difficulty, weakness, numbness and headaches.  Hematological: Negative for adenopathy.  Psychiatric/Behavioral: Negative for behavioral problems, dysphoric mood and agitation. The patient is not nervous/anxious.        Objective:   Physical Exam  Constitutional: She is oriented to person, place, and time. She appears well-developed and well-nourished.     Weight 286 blood pressure low normal  HENT:  Head: Normocephalic.  Right Ear: External ear normal.  Left Ear: External ear normal.  Mouth/Throat: Oropharynx is clear and moist.  Eyes: Conjunctivae and EOM are normal. Pupils are equal, round, and reactive to light.  Neck: Normal range of motion. Neck supple. No thyromegaly present.  Cardiovascular: Normal rate, regular rhythm, normal heart sounds and intact distal pulses.   Pulmonary/Chest: Effort normal and breath sounds normal.  Abdominal: Soft. Bowel sounds are normal. She exhibits no mass. There is no tenderness.  Musculoskeletal: Normal range of motion.  Lymphadenopathy:    She has no cervical adenopathy.  Neurological: She is alert and oriented to person, place, and time.  Skin: Skin is warm and dry. No rash noted.  Psychiatric: She has a normal mood and affect. Her behavior is normal.          Assessment & Plan:   Hypertension well controlled Morbid obesity. Options were discussed including referral to nutritional service. She will consider Chronic pain. Follow chronic pain management

## 2012-01-08 NOTE — Patient Instructions (Signed)
It is important that you exercise regularly, at least 20 minutes 3 to 4 times per week.  If you develop chest pain or shortness of breath seek  medical attention.  You need to lose weight.  Consider a lower calorie diet and regular exercise.  Please check your blood pressure on a regular basis.  If it is consistently greater than 150/90, please make an office appointment.  Return in 6 months for follow-up  

## 2012-01-08 NOTE — Progress Notes (Signed)
Addended by: Azucena Freed on: 01/08/2012 05:16 PM   Modules accepted: Orders

## 2012-01-09 LAB — CBC WITH DIFFERENTIAL/PLATELET
Basophils Relative: 0 % (ref 0–1)
Eosinophils Absolute: 0.2 10*3/uL (ref 0.0–0.7)
Eosinophils Relative: 2 % (ref 0–5)
Lymphs Abs: 4.9 10*3/uL — ABNORMAL HIGH (ref 0.7–4.0)
MCH: 30.9 pg (ref 26.0–34.0)
MCHC: 34.1 g/dL (ref 30.0–36.0)
MCV: 90.6 fL (ref 78.0–100.0)
Platelets: 337 10*3/uL (ref 150–400)
RBC: 4.69 MIL/uL (ref 3.87–5.11)
RDW: 13 % (ref 11.5–15.5)

## 2012-01-09 LAB — COMPREHENSIVE METABOLIC PANEL
ALT: 17 U/L (ref 0–35)
Alkaline Phosphatase: 93 U/L (ref 39–117)
CO2: 24 mEq/L (ref 19–32)
Potassium: 4.1 mEq/L (ref 3.5–5.3)
Sodium: 142 mEq/L (ref 135–145)
Total Bilirubin: 0.2 mg/dL — ABNORMAL LOW (ref 0.3–1.2)
Total Protein: 6.5 g/dL (ref 6.0–8.3)

## 2012-01-09 LAB — TSH: TSH: 7.223 u[IU]/mL — ABNORMAL HIGH (ref 0.350–4.500)

## 2012-01-09 LAB — LIPID PANEL: Total CHOL/HDL Ratio: 4.2 Ratio

## 2012-01-14 ENCOUNTER — Telehealth: Payer: Self-pay | Admitting: Internal Medicine

## 2012-01-14 DIAGNOSIS — R635 Abnormal weight gain: Secondary | ICD-10-CM

## 2012-01-14 NOTE — Telephone Encounter (Signed)
Pt need bloodwork results °

## 2012-01-14 NOTE — Telephone Encounter (Signed)
Please call/notify patient that lab/test/procedure is normal except thyroid tests are mildly abnormal. We'll recheck next office visit. Cholesterol 211

## 2012-01-14 NOTE — Telephone Encounter (Signed)
Please advise 

## 2012-01-15 NOTE — Telephone Encounter (Signed)
Pt aware.  Pt states she would like to investigate this problem due to weight gain.  Pt would like to know if she needs to weight until 07/07/2012 to redo lab work. Pls advise.

## 2012-01-18 NOTE — Telephone Encounter (Signed)
Attempt to call at work and home - lmtcb if questions - gave instructions for tsh in 4 wks - needs to call and be placed on lab shcedule - non fasting test. - future order doen

## 2012-01-18 NOTE — Telephone Encounter (Signed)
Suggest repeat TSH in 4 weeks and will need thyroid medication if still abnormal

## 2012-01-30 ENCOUNTER — Other Ambulatory Visit: Payer: Self-pay | Admitting: Internal Medicine

## 2012-02-11 ENCOUNTER — Other Ambulatory Visit (INDEPENDENT_AMBULATORY_CARE_PROVIDER_SITE_OTHER): Payer: BC Managed Care – PPO

## 2012-02-11 DIAGNOSIS — R635 Abnormal weight gain: Secondary | ICD-10-CM

## 2012-02-15 ENCOUNTER — Telehealth: Payer: Self-pay | Admitting: Internal Medicine

## 2012-02-15 NOTE — Telephone Encounter (Signed)
Pt would like blood work results °

## 2012-02-15 NOTE — Telephone Encounter (Signed)
Spoke with pt- informed of lab results - [facility]

## 2012-02-19 ENCOUNTER — Telehealth: Payer: Self-pay | Admitting: Internal Medicine

## 2012-02-19 NOTE — Telephone Encounter (Signed)
[facility] that we sent in on 2/25 for # 60 5RF -  Attempt to call pt - VM - LMTCB if questions , informed [facility] av[facility].

## 2012-02-19 NOTE — Telephone Encounter (Signed)
[facility] [address] [facility] [place] on [address] And was told that she had no refills remaining on her script for carvedilol (COREG) 12.5 MG tablet. Pls call in script with additional refills for 1 year and pt would like to be notified when this has been done. Pt has been out of med for 2 days.

## 2012-07-07 ENCOUNTER — Ambulatory Visit: Payer: BC Managed Care – PPO | Admitting: Internal Medicine

## 2012-08-04 ENCOUNTER — Other Ambulatory Visit: Payer: Self-pay | Admitting: Internal Medicine

## 2012-10-17 ENCOUNTER — Other Ambulatory Visit: Payer: Self-pay | Admitting: Neurosurgery

## 2012-10-17 DIAGNOSIS — M47812 Spondylosis without myelopathy or radiculopathy, cervical region: Secondary | ICD-10-CM

## 2012-11-11 DIAGNOSIS — M25512 Pain in left shoulder: Secondary | ICD-10-CM | POA: Insufficient documentation

## 2012-11-14 ENCOUNTER — Ambulatory Visit
Admission: RE | Admit: 2012-11-14 | Discharge: 2012-11-14 | Disposition: A | Discharge status: Home / Self Care | Payer: BC Managed Care – PPO | Source: Ambulatory Visit | Attending: Neurosurgery | Admitting: Neurosurgery

## 2012-11-14 VITALS — BP 99/80 | HR 67

## 2012-11-14 DIAGNOSIS — M47812 Spondylosis without myelopathy or radiculopathy, cervical region: Secondary | ICD-10-CM

## 2012-11-14 MED ORDER — DIAZEPAM 5 MG PO TABS
10.0000 mg | ORAL_TABLET | Freq: Once | ORAL | Status: AC
  Administered 2012-11-14: 10 mg via ORAL

## 2012-11-14 MED ORDER — MEPERIDINE HCL 100 MG/ML IJ SOLN
75.0000 mg | Freq: Once | INTRAMUSCULAR | Status: AC
  Administered 2012-11-14: 75 mg via INTRAMUSCULAR

## 2012-11-14 MED ORDER — IOHEXOL 300 MG/ML  SOLN
10.0000 mL | Freq: Once | INTRAMUSCULAR | Status: AC | PRN
  Administered 2012-11-14: 10 mL via INTRATHECAL

## 2012-11-14 MED ORDER — ONDANSETRON HCL 4 MG/2ML IJ SOLN
4.0000 mg | Freq: Once | INTRAMUSCULAR | Status: AC
  Administered 2012-11-14: 4 mg via INTRAMUSCULAR

## 2012-11-14 NOTE — Progress Notes (Signed)
Pt's husband is at bedside. Pt in good spirits post procedure.

## 2012-11-17 ENCOUNTER — Telehealth: Payer: Self-pay | Admitting: Radiology
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
## 2012-11-18 ENCOUNTER — Telehealth: Payer: Self-pay | Admitting: Radiology

## 2012-11-18 NOTE — Telephone Encounter (Signed)
Pt has decided she will try bedrest this weekend and will call Monday if she needs a blood patch.

## 2013-01-25 ENCOUNTER — Emergency Department (HOSPITAL_BASED_OUTPATIENT_CLINIC_OR_DEPARTMENT_OTHER)
Admission: EM | Admit: 2013-01-25 | Discharge: 2013-01-25 | Disposition: A | Discharge status: Home / Self Care | Payer: BC Managed Care – PPO | Attending: Emergency Medicine | Admitting: Emergency Medicine

## 2013-01-25 ENCOUNTER — Emergency Department (HOSPITAL_BASED_OUTPATIENT_CLINIC_OR_DEPARTMENT_OTHER): Payer: BC Managed Care – PPO

## 2013-01-25 ENCOUNTER — Ambulatory Visit: Payer: BC Managed Care – PPO | Admitting: Internal Medicine

## 2013-01-25 ENCOUNTER — Encounter (HOSPITAL_BASED_OUTPATIENT_CLINIC_OR_DEPARTMENT_OTHER): Payer: Self-pay | Admitting: *Deleted

## 2013-01-25 DIAGNOSIS — K279 Peptic ulcer, site unspecified, unspecified as acute or chronic, without hemorrhage or perforation: Secondary | ICD-10-CM | POA: Insufficient documentation

## 2013-01-25 DIAGNOSIS — L0292 Furuncle, unspecified: Secondary | ICD-10-CM | POA: Insufficient documentation

## 2013-01-25 DIAGNOSIS — I10 Essential (primary) hypertension: Secondary | ICD-10-CM | POA: Insufficient documentation

## 2013-01-25 DIAGNOSIS — K219 Gastro-esophageal reflux disease without esophagitis: Secondary | ICD-10-CM | POA: Insufficient documentation

## 2013-01-25 DIAGNOSIS — F17200 Nicotine dependence, unspecified, uncomplicated: Secondary | ICD-10-CM | POA: Insufficient documentation

## 2013-01-25 DIAGNOSIS — K589 Irritable bowel syndrome without diarrhea: Secondary | ICD-10-CM | POA: Insufficient documentation

## 2013-01-25 DIAGNOSIS — R109 Unspecified abdominal pain: Secondary | ICD-10-CM | POA: Insufficient documentation

## 2013-01-25 DIAGNOSIS — M5090 Cervical disc disorder, unspecified, unspecified cervical region: Secondary | ICD-10-CM | POA: Insufficient documentation

## 2013-01-25 DIAGNOSIS — Z79899 Other long term (current) drug therapy: Secondary | ICD-10-CM | POA: Insufficient documentation

## 2013-01-25 LAB — CBC WITH DIFFERENTIAL/PLATELET
Hemoglobin: 16.4 g/dL — ABNORMAL HIGH (ref 12.0–15.0)
Lymphocytes Relative: 40 % (ref 12–46)
Lymphs Abs: 3.3 10*3/uL (ref 0.7–4.0)
MCH: 32 pg (ref 26.0–34.0)
Monocytes Relative: 7 % (ref 3–12)
Neutro Abs: 4.4 10*3/uL (ref 1.7–7.7)
Neutrophils Relative %: 53 % (ref 43–77)
Platelets: 274 10*3/uL (ref 150–400)
RBC: 5.13 MIL/uL — ABNORMAL HIGH (ref 3.87–5.11)
WBC: 8.3 10*3/uL (ref 4.0–10.5)

## 2013-01-25 LAB — URINALYSIS, ROUTINE W REFLEX MICROSCOPIC
Bilirubin Urine: NEGATIVE
Glucose, UA: NEGATIVE mg/dL
Ketones, ur: NEGATIVE mg/dL
Leukocytes, UA: NEGATIVE
Nitrite: NEGATIVE
Protein, ur: NEGATIVE mg/dL
Specific Gravity, Urine: 1.006 (ref 1.005–1.030)
Urobilinogen, UA: 0.2 mg/dL (ref 0.0–1.0)
pH: 6.5 (ref 5.0–8.0)

## 2013-01-25 LAB — COMPREHENSIVE METABOLIC PANEL
AST: 16 U/L (ref 0–37)
BUN: 10 mg/dL (ref 6–23)
CO2: 25 mEq/L (ref 19–32)
Calcium: 9.8 mg/dL (ref 8.4–10.5)
Creatinine, Ser: 0.8 mg/dL (ref 0.50–1.10)
GFR calc Af Amer: >90 mL/min (ref >90)
GFR calc non Af Amer: >90 mL/min (ref >90)
Total Bilirubin: 0.5 mg/dL (ref 0.3–1.2)

## 2013-01-25 LAB — URINE MICROSCOPIC-ADD ON

## 2013-01-25 LAB — LIPASE, BLOOD: Lipase: 47 U/L (ref 11–59)

## 2013-01-25 LAB — PREGNANCY, URINE: Preg Test, Ur: NEGATIVE

## 2013-01-25 NOTE — ED Notes (Signed)
Pt report right flank pain which radiates toward back x 2 days

## 2013-01-25 NOTE — ED Provider Notes (Signed)
History     CSN: [id number]  Arrival date & time 01/25/13  1322   First MD Initiated Contact with Patient 01/25/13 1435      Chief Complaint  Patient presents with  . Flank Pain    (Consider location/radiation/quality/duration/timing/severity/associated sxs/prior treatment) Patient is a 37 y.o. female presenting with flank pain. The history is provided by the patient.  Flank Pain  She has been having right midabdominal pain for the last 3 days. It is neither worsening nor improving. She describes the pain as dull and achy and rates it at 7/10. There is no associated nausea or vomiting or diarrhea and she denies any urinary difficulty. She did have a similar pain in the past and had extensive evaluation with no diagnosis being found. She denies fever, chills, sweats. Appetite has been normal and eating does not affect the pain. Nothing makes it better and nothing makes it worse. She did try taking some Prevacid with no relief.  Past Medical History  Diagnosis Date  . Hypertension   . Morbid obesity   . Peptic ulcer disease     h. polyri 2003  . GERD (gastroesophageal reflux disease)   . IBS (irritable bowel syndrome)   . Cervical disc disease   . Furuncle     recurrent     Past Surgical History  Procedure Laterality Date  . Tonsillectomy  2006  . Back surgery      Family History  Problem Relation Age of Onset  . Asthma Mother   . Hypertension Mother   . Hypothyroidism Mother   . Hypertension Father   . Restless legs syndrome Father   . Sleep apnea Father   . Colon polyps Father     History  Substance Use Topics  . Smoking status: Current Every Day Smoker -- 1.00 packs/day    Types: Cigarettes  . Smokeless tobacco: Never Used  . Alcohol Use: No    OB History   Grav Para Term Preterm Abortions TAB SAB Ect Mult Living                  Review of Systems  Genitourinary: Positive for flank pain.  All other systems reviewed and are negative.    Allergies   Clarithromycin; Dicyclomine hcl; Lisinopril; and Sulfonamide derivatives  Home Medications   Current Outpatient Rx  Name  Route  Sig  Dispense  Refill  . carvedilol (COREG) 12.5 MG tablet      TAKE 1 TABLET TWICE A DAY   60 tablet   5   . esomeprazole (NEXIUM) 20 MG capsule   Oral   Take 20 mg by mouth daily before breakfast.         . HYDROcodone-acetaminophen (NORCO) 7.5-325 MG per tablet   Oral   Take 1 tablet by mouth every 6 (six) hours as needed.         Marland Kitchen oxyCODONE-acetaminophen (PERCOCET) 7.5-325 MG per tablet   Oral   Take 1 tablet by mouth every 6 (six) hours as needed.         . zolpidem (AMBIEN) 10 MG tablet   Oral   Take 10 mg by mouth at bedtime as needed.           BP 151/90  Pulse 74  Temp(Src) 99.5 F (37.5 C) (Oral)  Resp 16  Ht 5\' 6"  (1.676 m)  Wt 260 lb (117.935 kg)  BMI 41.99 kg/m2  SpO2 100%  LMP 12/26/2012  Physical Exam  Nursing note  and vitals reviewed.  37 year old female, resting comfortably and in no acute distress. Vital signs are significant for hypertension with blood pressure 151/90. Oxygen saturation is 100%, which is normal. Head is normocephalic and atraumatic. PERRLA, EOMI. Oropharynx is clear. Neck is nontender and supple without adenopathy or JVD. Back is nontender and there is no CVA tenderness. Lungs are clear without rales, wheezes, or rhonchi. Chest is nontender. Heart has regular rate and rhythm without murmur. Abdomen is soft, flat, with moderate right upper quadrant tenderness and a positive Murphy sign. There is no rebound or guarding. There are no masses or hepatosplenomegaly and peristalsis is normoactive. Extremities have no cyanosis or edema, full range of motion is present. Skin is warm and dry without rash. Neurologic: Mental status is normal, cranial nerves are intact, there are no motor or sensory deficits.  ED Course  Procedures (including critical care time)  Results for orders placed during  the hospital encounter of 01/25/13  URINALYSIS, ROUTINE W REFLEX MICROSCOPIC      Result Value Range   Color, Urine YELLOW  YELLOW   APPearance CLOUDY (*) CLEAR   Specific Gravity, Urine 1.006  1.005 - 1.030   pH 6.5  5.0 - 8.0   Glucose, UA NEGATIVE  NEGATIVE mg/dL   Hgb urine dipstick MODERATE (*) NEGATIVE   Bilirubin Urine NEGATIVE  NEGATIVE   Ketones, ur NEGATIVE  NEGATIVE mg/dL   Protein, ur NEGATIVE  NEGATIVE mg/dL   Urobilinogen, UA 0.2  0.0 - 1.0 mg/dL   Nitrite NEGATIVE  NEGATIVE   Leukocytes, UA NEGATIVE  NEGATIVE  PREGNANCY, URINE      Result Value Range   Preg Test, Ur NEGATIVE  NEGATIVE  URINE MICROSCOPIC-ADD ON      Result Value Range   Squamous Epithelial / LPF MANY (*) RARE   WBC, UA 3-6  <3 WBC/hpf   RBC / HPF 3-6  <3 RBC/hpf   Bacteria, UA MANY (*) RARE   Urine-Other MUCOUS PRESENT    COMPREHENSIVE METABOLIC PANEL      Result Value Range   Sodium 140  135 - 145 mEq/L   Potassium 3.9  3.5 - 5.1 mEq/L   Chloride 104  96 - 112 mEq/L   CO2 25  19 - 32 mEq/L   Glucose, Bld 97  70 - 99 mg/dL   BUN 10  6 - 23 mg/dL   Creatinine, Ser 0.98  0.50 - 1.10 mg/dL   Calcium 9.8  8.4 - 11.9 mg/dL   Total Protein 7.8  6.0 - 8.3 g/dL   Albumin 4.0  3.5 - 5.2 g/dL   AST 16  0 - 37 U/L   ALT 20  0 - 35 U/L   Alkaline Phosphatase 81  39 - 117 U/L   Total Bilirubin 0.5  0.3 - 1.2 mg/dL   GFR calc non Af Amer >90  >90 mL/min   GFR calc Af Amer >90  >90 mL/min  CBC WITH DIFFERENTIAL      Result Value Range   WBC 8.3  4.0 - 10.5 K/uL   RBC 5.13 (*) 3.87 - 5.11 MIL/uL   Hemoglobin 16.4 (*) 12.0 - 15.0 g/dL   HCT 14.7 (*) 82.9 - 56.2 %   MCV 92.0  78.0 - 100.0 fL   MCH 32.0  26.0 - 34.0 pg   MCHC 34.7  30.0 - 36.0 g/dL   RDW 13.0  86.5 - 78.4 %   Platelets 274  150 - 400  K/uL   Neutrophils Relative 53  43 - 77 %   Neutro Abs 4.4  1.7 - 7.7 K/uL   Lymphocytes Relative 40  12 - 46 %   Lymphs Abs 3.3  0.7 - 4.0 K/uL   Monocytes Relative 7  3 - 12 %   Monocytes Absolute  0.6  0.1 - 1.0 K/uL   Eosinophils Relative 1  0 - 5 %   Eosinophils Absolute 0.1  0.0 - 0.7 K/uL   Basophils Relative 0  0 - 1 %   Basophils Absolute 0.0  0.0 - 0.1 K/uL  LIPASE, BLOOD      Result Value Range   Lipase 47  11 - 59 U/L   US Abdomen Complete  01/25/2013  *RADIOLOGY REPORT*  Abdominal ultrasound  History:  Abdominal pain  Findings:  Gallbladder is visualized in multiple projections. There are no gallstones, gallbladder wall thickening, or pericholecystic fluid collection.  There is no intrahepatic, common hepatic, or common bile duct dilatation.  Pancreas is obscured by gas.  There may be mild fatty infiltration in the liver.  No focal liver lesions are identified.  Spleen is normal in size and homogeneous echotexture.  Kidneys bilaterally appear normal.  There is no ascites.  Aorta is nonaneurysmal.  Portions of the inferior vena cava obscured. Visualized portions of the inferior vena cava are patent.  Conclusion:  There is mild fatty infiltration of the liver.  Pancreas and portions of inferior vena cava not visualized due to gas.  Study otherwise unremarkable.  In particular, gallbladder appears normal.   Original Report Authenticated By: Bretta Bang, M.D.     Images viewed by me.   1. Abdominal pain       MDM  Abdominal pain of uncertain cause. She certainly is tender in the area of the gallbladder, but symptoms are not typical of biliary colic. Old records are reviewed and she had a similar presentation to emergency in 2009 at which time CT of the abdomen and ultrasound were unremarkable. 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  Workup is unremarkable. She is referred to gastroenterology for further evaluation.      Dione Booze, MD 01/25/13 [phone]

## 2013-01-26 LAB — URINE CULTURE: Colony Count: 80000

## 2013-02-03 ENCOUNTER — Other Ambulatory Visit (INDEPENDENT_AMBULATORY_CARE_PROVIDER_SITE_OTHER): Payer: BC Managed Care – PPO

## 2013-02-03 LAB — HEPATIC FUNCTION PANEL
Albumin: 3.5 g/dL (ref 3.5–5.2)
Total Bilirubin: 0.5 mg/dL (ref 0.3–1.2)

## 2013-02-03 LAB — POCT URINALYSIS DIPSTICK
Glucose, UA: NEGATIVE
Leukocytes, UA: NEGATIVE
Protein, UA: NEGATIVE
Urobilinogen, UA: 0.2

## 2013-02-03 LAB — BASIC METABOLIC PANEL
BUN: 9 mg/dL (ref 6–23)
CO2: 26 mEq/L (ref 19–32)
Calcium: 9 mg/dL (ref 8.4–10.5)
GFR: 95.09 mL/min (ref >60.00)
Glucose, Bld: 80 mg/dL (ref 70–99)
Potassium: 3.6 mEq/L (ref 3.5–5.1)
Sodium: 139 mEq/L (ref 135–145)

## 2013-02-03 LAB — CBC WITH DIFFERENTIAL/PLATELET
Basophils Relative: 0.3 % (ref 0.0–3.0)
Eosinophils Relative: 2 % (ref 0.0–5.0)
HCT: 45 % (ref 36.0–46.0)
Hemoglobin: 15.1 g/dL — ABNORMAL HIGH (ref 12.0–15.0)
Lymphs Abs: 3.3 10*3/uL (ref 0.7–4.0)
MCV: 94.1 fl (ref 78.0–100.0)
Monocytes Absolute: 0.4 10*3/uL (ref 0.1–1.0)
Monocytes Relative: 5.7 % (ref 3.0–12.0)
Platelets: 289 10*3/uL (ref 150.0–400.0)
RBC: 4.78 Mil/uL (ref 3.87–5.11)
WBC: 7.4 10*3/uL (ref 4.5–10.5)

## 2013-02-03 LAB — LIPID PANEL
HDL: 50.3 mg/dL (ref >39.00)
VLDL: 10.2 mg/dL (ref 0.0–40.0)

## 2013-02-03 LAB — LDL CHOLESTEROL, DIRECT: Direct LDL: 146.7 mg/dL

## 2013-02-03 LAB — TSH: TSH: 1.99 u[IU]/mL (ref 0.35–5.50)

## 2013-02-10 ENCOUNTER — Encounter: Payer: BC Managed Care – PPO | Admitting: Internal Medicine

## 2013-02-15 ENCOUNTER — Other Ambulatory Visit: Payer: Self-pay | Admitting: Internal Medicine

## 2013-08-23 ENCOUNTER — Other Ambulatory Visit: Payer: Self-pay | Admitting: Internal Medicine

## 2013-09-22 ENCOUNTER — Other Ambulatory Visit: Payer: Self-pay | Admitting: Internal Medicine

## 2013-09-25 NOTE — Telephone Encounter (Signed)
Pt has appt on 10/03/13

## 2013-10-03 ENCOUNTER — Ambulatory Visit (INDEPENDENT_AMBULATORY_CARE_PROVIDER_SITE_OTHER): Payer: BC Managed Care – PPO | Admitting: Internal Medicine

## 2013-10-03 ENCOUNTER — Encounter: Payer: Self-pay | Admitting: Internal Medicine

## 2013-10-03 VITALS — BP 130/90 | HR 67 | Temp 98.2°F | Resp 20 | Wt 278.0 lb

## 2013-10-03 DIAGNOSIS — Z23 Encounter for immunization: Secondary | ICD-10-CM

## 2013-10-03 DIAGNOSIS — M5030 Other cervical disc degeneration, unspecified cervical region: Secondary | ICD-10-CM

## 2013-10-03 DIAGNOSIS — I10 Essential (primary) hypertension: Secondary | ICD-10-CM

## 2013-10-03 MED ORDER — PHENTERMINE HCL 37.5 MG PO CAPS: 37.5000 mg | ORAL_CAPSULE | ORAL | Status: DC

## 2013-10-03 NOTE — Progress Notes (Signed)
Subjective:    Patient ID: Julie [name], female    DOB: May 08, 1976, 37 y.o.   MRN: [id number]  HPI  37 year old patient who is seen today for followup. She has treated hypertension which has been controlled with carvedilol and has not been seen here in over one year. She has chronic cervical disc disease and is followed by pain management. She is contemplating the C-spine surgery due to the chronic pain. She has a history of morbid obesity. There has been some modest weight loss since her last visit. She is asking about assistance with weight loss.  Past Medical History  Diagnosis Date  . Hypertension   . Morbid obesity   . Peptic ulcer disease     h. polyri 2003  . GERD (gastroesophageal reflux disease)   . IBS (irritable bowel syndrome)   . Cervical disc disease   . Furuncle     recurrent     History   Social History  . Marital Status: Married    Spouse Name: N/A    Number of Children: N/A  . Years of Education: N/A   Occupational History  . Not on file.   Social History Main Topics  . Smoking status: Current Every Day Smoker -- 1.00 packs/day    Types: Cigarettes  . Smokeless tobacco: Never Used  . Alcohol Use: No  . Drug Use: No  . Sexual Activity: Yes    Birth Control/ Protection: None   Other Topics Concern  . Not on file   Social History Narrative  . No narrative on file    Past Surgical History  Procedure Laterality Date  . Tonsillectomy  2006  . Back surgery      Family History  Problem Relation Age of Onset  . Asthma Mother   . Hypertension Mother   . Hypothyroidism Mother   . Hypertension Father   . Restless legs syndrome Father   . Sleep apnea Father   . Colon polyps Father     Allergies  Allergen Reactions  . Clarithromycin Nausea And Vomiting  . Dicyclomine Hcl Nausea And Vomiting  . Lisinopril Palpitations  . Sulfonamide Derivatives Nausea And Vomiting    Current Outpatient Prescriptions on File Prior to Visit  Medication  Sig Dispense Refill  . carvedilol (COREG) 12.5 MG tablet Take 1 tablet (12.5 mg total) by mouth 2 (two) times daily with a meal.  60 tablet  0  . zolpidem (AMBIEN) 10 MG tablet Take 10 mg by mouth at bedtime as needed.       No current facility-administered medications on file prior to visit.    BP 130/90  Pulse 67  Temp(Src) 98.2 F (36.8 C) (Oral)  Resp 20  Wt 278 lb (126.1 kg)  BMI 44.89 kg/m2  SpO2 97%  LMP 09/13/2013     Review of Systems  Constitutional: Negative.   HENT: Negative for congestion, dental problem, hearing loss, rhinorrhea, sinus pressure, sore throat and tinnitus.   Eyes: Negative for pain, discharge and visual disturbance.  Respiratory: Negative for cough and shortness of breath.   Cardiovascular: Negative for chest pain, palpitations and leg swelling.  Gastrointestinal: Negative for nausea, vomiting, abdominal pain, diarrhea, constipation, blood in stool and abdominal distention.  Genitourinary: Negative for dysuria, urgency, frequency, hematuria, flank pain, vaginal bleeding, vaginal discharge, difficulty urinating, vaginal pain and pelvic pain.  Musculoskeletal: Positive for arthralgias, back pain and neck pain. Negative for gait problem and joint swelling.  Skin: Negative for rash.  Neurological: Negative  for dizziness, syncope, speech difficulty, weakness, numbness and headaches.  Hematological: Negative for adenopathy.  Psychiatric/Behavioral: Negative for behavioral problems, dysphoric mood and agitation. The patient is not nervous/anxious.        Objective:   Physical Exam  Constitutional: She is oriented to person, place, and time. She appears well-developed and well-nourished.  Blood pressure 130/80  HENT:  Head: Normocephalic.  Right Ear: External ear normal.  Left Ear: External ear normal.  Mouth/Throat: Oropharynx is clear and moist.  Eyes: Conjunctivae and EOM are normal. Pupils are equal, round, and reactive to light.  Neck: Normal  range of motion. Neck supple. No thyromegaly present.  Cardiovascular: Normal rate, regular rhythm, normal heart sounds and intact distal pulses.   Pulmonary/Chest: Effort normal and breath sounds normal.  Abdominal: Soft. Bowel sounds are normal. She exhibits no mass. There is no tenderness.  Musculoskeletal: Normal range of motion.  Lymphadenopathy:    She has no cervical adenopathy.  Neurological: She is alert and oriented to person, place, and time.  Skin: Skin is warm and dry. No rash noted.  Psychiatric: She has a normal mood and affect. Her behavior is normal.          Assessment & Plan:   Hypertension well controlled Morbid obesity.  We'll set up for dietary consultation and also start phentermine. Recheck 3 months Chronic neck and low back pain

## 2013-10-03 NOTE — Patient Instructions (Signed)
Limit your sodium (Salt) intake  Please check your blood pressure on a regular basis.  If it is consistently greater than 150/90, please make an office appointment.  You need to lose weight.  Consider a lower calorie diet and regular exercise.  Return in 3 months for follow-up DASH Diet The DASH diet stands for "Dietary Approaches to Stop Hypertension." It is a healthy eating plan that has been shown to reduce high blood pressure (hypertension) in as little as 14 days, while also possibly providing other significant health benefits. These other health benefits include reducing the risk of breast cancer after menopause and reducing the risk of type 2 diabetes, heart disease, colon cancer, and stroke. Health benefits also include weight loss and slowing kidney failure in patients with chronic kidney disease.  DIET GUIDELINES  Limit salt (sodium). Your diet should contain less than 1500 mg of sodium daily.  Limit refined or processed carbohydrates. Your diet should include mostly whole grains. Desserts and added sugars should be used sparingly.  Include small amounts of heart-healthy fats. These types of fats include nuts, oils, and tub margarine. Limit saturated and trans fats. These fats have been shown to be harmful in the body. CHOOSING FOODS  The following food groups are based on a 2000 calorie diet. See your Registered Dietitian for individual calorie needs. Grains and Grain Products (6 to 8 servings daily)  Eat More Often: Whole-wheat bread, brown rice, whole-grain or wheat pasta, quinoa, popcorn without added fat or salt (air popped).  Eat Less Often: White bread, white pasta, white rice, cornbread. Vegetables (4 to 5 servings daily)  Eat More Often: Fresh, frozen, and canned vegetables. Vegetables may be raw, steamed, roasted, or grilled with a minimal amount of fat.  Eat Less Often/Avoid: Creamed or fried vegetables. Vegetables in a cheese sauce. Fruit (4 to 5 servings  daily)  Eat More Often: All fresh, canned (in natural juice), or frozen fruits. Dried fruits without added sugar. One hundred percent fruit juice ( cup [237 mL] daily).  Eat Less Often: Dried fruits with added sugar. Canned fruit in light or heavy syrup. [employer], Fish, and Poultry (2 servings or less daily. One serving is 3 to 4 oz [85-114 g]).  Eat More Often: Ninety percent or leaner ground beef, tenderloin, sirloin. Round cuts of beef, chicken breast, [place] breast. All fish. Grill, bake, or broil your meat. Nothing should be fried.  Eat Less Often/Avoid: Fatty cuts of meat, [place], or chicken leg, thigh, or wing. Fried cuts of meat or fish. Dairy (2 to 3 servings)  Eat More Often: Low-fat or fat-free milk, low-fat plain or light yogurt, reduced-fat or part-skim cheese.  Eat Less Often/Avoid: Milk (whole, 2%).Whole milk yogurt. Full-fat cheeses. Nuts, Seeds, and Legumes (4 to 5 servings per week)  Eat More Often: All without added salt.  Eat Less Often/Avoid: Salted nuts and seeds, canned beans with added salt. Fats and Sweets (limited)  Eat More Often: Vegetable oils, tub margarines without trans fats, sugar-free gelatin. Mayonnaise and salad dressings.  Eat Less Often/Avoid: Coconut oils, palm oils, butter, stick margarine, cream, half and half, cookies, candy, pie. FOR MORE INFORMATION The Dash Diet Eating Plan: www.dashdiet.org Document Released: 11/12/2011 Document Revised: 02/15/2012 Document Reviewed: 11/12/2011 [facility] Patient Information 2014 [place], [place].

## 2013-10-25 ENCOUNTER — Other Ambulatory Visit: Payer: Self-pay | Admitting: Internal Medicine

## 2014-01-03 ENCOUNTER — Ambulatory Visit: Payer: BC Managed Care – PPO | Admitting: Internal Medicine

## 2014-01-03 DIAGNOSIS — Z0289 Encounter for other administrative examinations: Secondary | ICD-10-CM

## 2014-01-18 ENCOUNTER — Other Ambulatory Visit: Payer: Self-pay | Admitting: Internal Medicine

## 2014-01-22 ENCOUNTER — Telehealth: Payer: Self-pay | Admitting: Internal Medicine

## 2014-01-22 NOTE — Telephone Encounter (Signed)
[facility] denied Phentermine due to pt needs a baseline body weight of greater than or equal to 5%

## 2014-01-22 NOTE — Telephone Encounter (Signed)
Left message on voicemail to call office.  

## 2014-01-24 NOTE — Telephone Encounter (Signed)
Left message on voicemail to call office.  

## 2014-02-26 ENCOUNTER — Telehealth: Payer: Self-pay | Admitting: Internal Medicine

## 2014-02-26 NOTE — Telephone Encounter (Signed)
Patient Information:  Caller Name: Julie [name]  Phone: [phone]  Patient: Julie [name], Julie [name]  Gender: Female  DOB: [birth year]  Age: 38 Years  PCP: Eleonore ChiquitoKwiatkowski, Peter (Family Practice > 8466yrs old)  Pregnant: No  Office Follow Up:  Does the office need to follow up with this patient?: Yes  Instructions For The Office: Plan of care Chest discomfort, SOB  RN Note:  Patient calling regarding SOB, sweaty palms, fast heart beat, "Chest discomfort".  States "I feel like I've just run a marathon."  B/[name] 140/94.  Symptoms  Reason For Call & Symptoms: blood pressure & SOB  Reviewed Health History In EMR: Yes  Reviewed Medications In EMR: Yes  Reviewed Allergies In EMR: Yes  Reviewed Surgeries / Procedures: Yes  Date of Onset of Symptoms: 02/24/2014 OB / GYN:  LMP: 02/23/2014  Guideline(s) Used:  Chest Pain  Disposition Per Guideline:   Go to ED Now  Reason For Disposition Reached:   Heart beating irregularly or very rapidly  Advice Given:  N/A  RN Overrode Recommendation:  Follow Up With Office Later  SOB, rapid heart rate, chest discomfort

## 2014-02-26 NOTE — Telephone Encounter (Signed)
Patient is going to the ER per Dr Kirtland BouchardK.  She states that her pulse was in the 80's.

## 2014-02-27 ENCOUNTER — Telehealth: Payer: Self-pay | Admitting: Internal Medicine

## 2014-02-27 NOTE — Telephone Encounter (Signed)
Julie [name], yes you can.

## 2014-02-27 NOTE — Telephone Encounter (Signed)
Pt needs post er fup around 4pm. Can I create 30 min slot?

## 2014-02-28 NOTE — Telephone Encounter (Signed)
Pt has been sch for 03-02-14

## 2014-03-02 ENCOUNTER — Ambulatory Visit: Payer: BC Managed Care – PPO | Admitting: Internal Medicine

## 2014-03-02 DIAGNOSIS — Z0289 Encounter for other administrative examinations: Secondary | ICD-10-CM

## 2014-04-15 ENCOUNTER — Other Ambulatory Visit: Payer: Self-pay | Admitting: Internal Medicine

## 2014-07-06 DIAGNOSIS — F411 Generalized anxiety disorder: Secondary | ICD-10-CM | POA: Insufficient documentation

## 2014-07-16 ENCOUNTER — Other Ambulatory Visit: Payer: Self-pay | Admitting: Internal Medicine

## 2014-08-15 ENCOUNTER — Other Ambulatory Visit: Payer: Self-pay | Admitting: Internal Medicine

## 2014-09-13 ENCOUNTER — Other Ambulatory Visit: Payer: Self-pay | Admitting: Internal Medicine

## 2014-10-29 ENCOUNTER — Other Ambulatory Visit: Payer: Self-pay | Admitting: Neurosurgery

## 2014-10-31 ENCOUNTER — Encounter (HOSPITAL_COMMUNITY): Payer: Self-pay

## 2014-10-31 ENCOUNTER — Ambulatory Visit (HOSPITAL_COMMUNITY)
Admission: RE | Admit: 2014-10-31 | Discharge: 2014-10-31 | Disposition: A | Discharge status: Home / Self Care | Payer: BC Managed Care – PPO | Source: Ambulatory Visit | Attending: Anesthesiology | Admitting: Anesthesiology

## 2014-10-31 ENCOUNTER — Encounter (HOSPITAL_COMMUNITY)
Admission: RE | Admit: 2014-10-31 | Discharge: 2014-10-31 | Disposition: A | Discharge status: Home / Self Care | Payer: BC Managed Care – PPO | Source: Ambulatory Visit | Attending: Neurosurgery | Admitting: Neurosurgery

## 2014-10-31 DIAGNOSIS — R059 Cough, unspecified: Secondary | ICD-10-CM

## 2014-10-31 DIAGNOSIS — Z01818 Encounter for other preprocedural examination: Secondary | ICD-10-CM | POA: Insufficient documentation

## 2014-10-31 DIAGNOSIS — R05 Cough: Secondary | ICD-10-CM | POA: Insufficient documentation

## 2014-10-31 DIAGNOSIS — M5030 Other cervical disc degeneration, unspecified cervical region: Secondary | ICD-10-CM | POA: Diagnosis not present

## 2014-10-31 HISTORY — DX: Hypothyroidism, unspecified: E03.9

## 2014-10-31 HISTORY — DX: Pneumonia, unspecified organism: J18.9

## 2014-10-31 HISTORY — DX: Insomnia, unspecified: G47.00

## 2014-10-31 HISTORY — DX: Bronchitis, not specified as acute or chronic: J40

## 2014-10-31 LAB — BASIC METABOLIC PANEL
Anion gap: 15 (ref 5–15)
BUN: 7 mg/dL (ref 6–23)
CALCIUM: 9.4 mg/dL (ref 8.4–10.5)
CO2: 23 meq/L (ref 19–32)
Chloride: 100 mEq/L (ref 96–112)
Creatinine, Ser: 0.68 mg/dL (ref 0.50–1.10)
GFR calc Af Amer: >90 mL/min (ref >90)
GLUCOSE: 65 mg/dL — AB (ref 70–99)
Potassium: 4 mEq/L (ref 3.7–5.3)
Sodium: 138 mEq/L (ref 137–147)

## 2014-10-31 LAB — CBC
HEMATOCRIT: 47.6 % — AB (ref 36.0–46.0)
HEMOGLOBIN: 16.5 g/dL — AB (ref 12.0–15.0)
MCH: 32.3 pg (ref 26.0–34.0)
MCHC: 34.7 g/dL (ref 30.0–36.0)
MCV: 93.2 fL (ref 78.0–100.0)
Platelets: 295 10*3/uL (ref 150–400)
RBC: 5.11 MIL/uL (ref 3.87–5.11)
RDW: 12.7 % (ref 11.5–15.5)
WBC: 9 10*3/uL (ref 4.0–10.5)

## 2014-10-31 LAB — HCG, SERUM, QUALITATIVE: Preg, Serum: NEGATIVE

## 2014-10-31 LAB — SURGICAL PCR SCREEN
MRSA, PCR: NEGATIVE
Staphylococcus aureus: NEGATIVE

## 2014-10-31 NOTE — Progress Notes (Signed)
PCP is Eleonore ChiquitoPeter Kwiatkowski. Patient denied having a stress test, cardiac cath, or sleep study.

## 2014-10-31 NOTE — Pre-Procedure Instructions (Signed)
Barnet PallShannon P Earhart  10/31/2014   Your procedure is scheduled on:  Wednesday November 07, 2014 at 8:30 AM.  [facility] at 6:30 AM.  Call this number if you have problems the morning of surgery: [phone]   Remember:   Do not eat food or drink liquids after midnight.   Take these medicines the morning of surgery with A SIP OF WATER: Carvedilol (Coreg), Oxycodone if needed, Tizanidine (Zanaflex), Levothyroxine (Synthroid)   Do not wear jewelry, make-up or nail polish.  Do not wear lotions, powders, or perfumes.  Do not shave 48 hours prior to surgery.   Do not bring valuables to the hospital.  [facility] is not responsible for any belongings or valuables.               Contacts, dentures or bridgework may not be worn into surgery.  Leave suitcase in the car. After surgery it may be brought to your room.  For patients admitted to the hospital, discharge time is determined by your treatment team.               Patients discharged the day of surgery will not be allowed to drive home.  Name and phone number of your driver:   Special Instructions: Shower using CHG soap the night before and the morning of your surgery   Please read over the following fact sheets that you were given: Pain Booklet, Coughing and Deep Breathing, MRSA Information and Surgical Site Infection Prevention

## 2014-11-02 ENCOUNTER — Encounter (HOSPITAL_COMMUNITY): Payer: Self-pay

## 2014-11-02 NOTE — Progress Notes (Signed)
Anesthesia Chart Review:  Pt is 38 year old female scheduled for 3 level anterior cervical decompression/diskectomy and hardware removal on 11/07/2014 with Dr. Wynetta Emeryram.   PMH: HTN, hypothyroidism, PUD, GERD. Current smoker. BMI 46  Preoperative labs reviewed.  Glucose 65.  EKG: Sinus bradycardia, 59 bpm borderline PR prolongation No significant change since last tracing 09/17/10  If no changes, I anticipate pt can proceed with surgery as scheduled.   Rica Mastngela Starasia Sinko, FNP-BC [facility]Anesthesiology Phone: [phone] 11/02/2014 1:52 PM

## 2014-11-06 HISTORY — PX: CERVICAL SPINE SURGERY: SHX589

## 2014-11-06 NOTE — Progress Notes (Signed)
Patient called with time change. To arrive at 700

## 2014-11-07 ENCOUNTER — Encounter (HOSPITAL_COMMUNITY)
Admission: RE | Disposition: A | Discharge status: Home / Self Care | Payer: Self-pay | Source: Ambulatory Visit | Attending: Neurosurgery

## 2014-11-07 ENCOUNTER — Inpatient Hospital Stay (HOSPITAL_COMMUNITY): Payer: BC Managed Care – PPO

## 2014-11-07 ENCOUNTER — Inpatient Hospital Stay (HOSPITAL_COMMUNITY): Payer: BC Managed Care – PPO | Admitting: Anesthesiology

## 2014-11-07 ENCOUNTER — Inpatient Hospital Stay (HOSPITAL_COMMUNITY): Payer: BC Managed Care – PPO | Admitting: Emergency Medicine

## 2014-11-07 ENCOUNTER — Ambulatory Visit (HOSPITAL_COMMUNITY)
Admission: RE | Admit: 2014-11-07 | Discharge: 2014-11-08 | Disposition: A | Discharge status: Home / Self Care | Payer: BC Managed Care – PPO | Source: Ambulatory Visit | Attending: Neurosurgery | Admitting: Neurosurgery

## 2014-11-07 ENCOUNTER — Encounter (HOSPITAL_COMMUNITY): Payer: Self-pay | Admitting: *Deleted

## 2014-11-07 DIAGNOSIS — K589 Irritable bowel syndrome without diarrhea: Secondary | ICD-10-CM | POA: Diagnosis not present

## 2014-11-07 DIAGNOSIS — Z888 Allergy status to other drugs, medicaments and biological substances status: Secondary | ICD-10-CM | POA: Diagnosis not present

## 2014-11-07 DIAGNOSIS — M47892 Other spondylosis, cervical region: Secondary | ICD-10-CM | POA: Diagnosis not present

## 2014-11-07 DIAGNOSIS — M4802 Spinal stenosis, cervical region: Secondary | ICD-10-CM | POA: Diagnosis not present

## 2014-11-07 DIAGNOSIS — E039 Hypothyroidism, unspecified: Secondary | ICD-10-CM | POA: Insufficient documentation

## 2014-11-07 DIAGNOSIS — Z881 Allergy status to other antibiotic agents status: Secondary | ICD-10-CM | POA: Insufficient documentation

## 2014-11-07 DIAGNOSIS — Z6841 Body Mass Index (BMI) 40.0 and over, adult: Secondary | ICD-10-CM | POA: Diagnosis not present

## 2014-11-07 DIAGNOSIS — M1990 Unspecified osteoarthritis, unspecified site: Secondary | ICD-10-CM | POA: Diagnosis not present

## 2014-11-07 DIAGNOSIS — M5020 Other cervical disc displacement, unspecified cervical region: Secondary | ICD-10-CM

## 2014-11-07 DIAGNOSIS — K219 Gastro-esophageal reflux disease without esophagitis: Secondary | ICD-10-CM | POA: Insufficient documentation

## 2014-11-07 DIAGNOSIS — I10 Essential (primary) hypertension: Secondary | ICD-10-CM | POA: Insufficient documentation

## 2014-11-07 DIAGNOSIS — Z882 Allergy status to sulfonamides status: Secondary | ICD-10-CM | POA: Diagnosis not present

## 2014-11-07 DIAGNOSIS — G4700 Insomnia, unspecified: Secondary | ICD-10-CM | POA: Diagnosis not present

## 2014-11-07 DIAGNOSIS — M5412 Radiculopathy, cervical region: Secondary | ICD-10-CM | POA: Diagnosis not present

## 2014-11-07 DIAGNOSIS — F17210 Nicotine dependence, cigarettes, uncomplicated: Secondary | ICD-10-CM | POA: Insufficient documentation

## 2014-11-07 HISTORY — PX: ANTERIOR CERVICAL DECOMP/DISCECTOMY FUSION: SHX1161

## 2014-11-07 SURGERY — ANTERIOR CERVICAL DECOMPRESSION/DISCECTOMY FUSION 3 LEVEL/HARDWARE REMOVAL
Anesthesia: General | Site: Spine Cervical

## 2014-11-07 MED ORDER — SODIUM CHLORIDE 0.9 % IJ SOLN: 3.0000 mL | INTRAMUSCULAR | Status: DC | PRN

## 2014-11-07 MED ORDER — ROCURONIUM BROMIDE 100 MG/10ML IV SOLN
INTRAVENOUS | Status: DC | PRN
  Administered 2014-11-07 (×2): 10 mg via INTRAVENOUS
  Administered 2014-11-07: 50 mg via INTRAVENOUS

## 2014-11-07 MED ORDER — DOCUSATE SODIUM 100 MG PO CAPS
100.0000 mg | ORAL_CAPSULE | Freq: Two times a day (BID) | ORAL | Status: DC
  Administered 2014-11-07: 100 mg via ORAL
  Filled 2014-11-07 (×3): qty 1

## 2014-11-07 MED ORDER — ROCURONIUM BROMIDE 50 MG/5ML IV SOLN
INTRAVENOUS | Status: AC
  Filled 2014-11-07: qty 1

## 2014-11-07 MED ORDER — CARVEDILOL 12.5 MG PO TABS
12.5000 mg | ORAL_TABLET | Freq: Two times a day (BID) | ORAL | Status: DC
  Administered 2014-11-07: 12.5 mg via ORAL
  Filled 2014-11-07 (×4): qty 1

## 2014-11-07 MED ORDER — LIDOCAINE HCL 4 % MT SOLN
OROMUCOSAL | Status: DC | PRN
  Administered 2014-11-07: 3 mL via TOPICAL

## 2014-11-07 MED ORDER — ONDANSETRON HCL 4 MG/2ML IJ SOLN
INTRAMUSCULAR | Status: DC | PRN
  Administered 2014-11-07: 4 mg via INTRAVENOUS

## 2014-11-07 MED ORDER — GLYCOPYRROLATE 0.2 MG/ML IJ SOLN
INTRAMUSCULAR | Status: AC
  Filled 2014-11-07: qty 2

## 2014-11-07 MED ORDER — ACETAMINOPHEN 650 MG RE SUPP: 650.0000 mg | RECTAL | Status: DC | PRN

## 2014-11-07 MED ORDER — SODIUM CHLORIDE 0.9 % IV SOLN: 250.0000 mL | INTRAVENOUS | Status: DC

## 2014-11-07 MED ORDER — ONDANSETRON HCL 4 MG/2ML IJ SOLN: 4.0000 mg | INTRAMUSCULAR | Status: DC | PRN

## 2014-11-07 MED ORDER — SODIUM CHLORIDE 0.9 % IJ SOLN
INTRAMUSCULAR | Status: AC
  Filled 2014-11-07: qty 10

## 2014-11-07 MED ORDER — 0.9 % SODIUM CHLORIDE (POUR BTL) OPTIME
TOPICAL | Status: DC | PRN
  Administered 2014-11-07: 1000 mL

## 2014-11-07 MED ORDER — FENTANYL CITRATE 0.05 MG/ML IJ SOLN
INTRAMUSCULAR | Status: AC
  Filled 2014-11-07: qty 5

## 2014-11-07 MED ORDER — FENTANYL CITRATE 0.05 MG/ML IJ SOLN
INTRAMUSCULAR | Status: DC | PRN
  Administered 2014-11-07: 50 ug via INTRAVENOUS
  Administered 2014-11-07: 100 ug via INTRAVENOUS
  Administered 2014-11-07: 50 ug via INTRAVENOUS
  Administered 2014-11-07: 100 ug via INTRAVENOUS
  Administered 2014-11-07 (×2): 50 ug via INTRAVENOUS
  Administered 2014-11-07: 100 ug via INTRAVENOUS

## 2014-11-07 MED ORDER — THROMBIN 5000 UNITS EX SOLR
OROMUCOSAL | Status: DC | PRN
  Administered 2014-11-07 (×2): via TOPICAL

## 2014-11-07 MED ORDER — PROMETHAZINE HCL 25 MG/ML IJ SOLN: 6.2500 mg | INTRAMUSCULAR | Status: DC | PRN

## 2014-11-07 MED ORDER — MIDAZOLAM HCL 2 MG/2ML IJ SOLN
INTRAMUSCULAR | Status: AC
  Filled 2014-11-07: qty 2

## 2014-11-07 MED ORDER — PROPOFOL 10 MG/ML IV BOLUS
INTRAVENOUS | Status: DC | PRN
  Administered 2014-11-07: 200 mg via INTRAVENOUS

## 2014-11-07 MED ORDER — HYDROMORPHONE HCL 1 MG/ML IJ SOLN
INTRAMUSCULAR | Status: AC
  Administered 2014-11-07: 1 mg
  Filled 2014-11-07: qty 1

## 2014-11-07 MED ORDER — NEOSTIGMINE METHYLSULFATE 10 MG/10ML IV SOLN
INTRAVENOUS | Status: AC
  Filled 2014-11-07: qty 1

## 2014-11-07 MED ORDER — ONDANSETRON HCL 4 MG/2ML IJ SOLN
INTRAMUSCULAR | Status: AC
  Filled 2014-11-07: qty 2

## 2014-11-07 MED ORDER — MIDAZOLAM HCL 5 MG/5ML IJ SOLN
INTRAMUSCULAR | Status: DC | PRN
  Administered 2014-11-07: 2 mg via INTRAVENOUS

## 2014-11-07 MED ORDER — LEVOTHYROXINE SODIUM 50 MCG PO TABS
50.0000 ug | ORAL_TABLET | Freq: Every day | ORAL | Status: DC
  Administered 2014-11-08: 50 ug via ORAL
  Filled 2014-11-07 (×2): qty 1

## 2014-11-07 MED ORDER — OXYCODONE-ACETAMINOPHEN 5-325 MG PO TABS
ORAL_TABLET | ORAL | Status: AC
  Filled 2014-11-07: qty 2

## 2014-11-07 MED ORDER — HYDROMORPHONE HCL 1 MG/ML IJ SOLN
0.5000 mg | INTRAMUSCULAR | Status: DC | PRN
  Administered 2014-11-07 (×3): 1 mg via INTRAVENOUS
  Filled 2014-11-07 (×3): qty 1

## 2014-11-07 MED ORDER — NEOSTIGMINE METHYLSULFATE 10 MG/10ML IV SOLN
INTRAVENOUS | Status: DC | PRN
  Administered 2014-11-07: 3 mg via INTRAVENOUS

## 2014-11-07 MED ORDER — SODIUM CHLORIDE 0.9 % IJ SOLN
3.0000 mL | Freq: Two times a day (BID) | INTRAMUSCULAR | Status: DC
  Administered 2014-11-07: 3 mL via INTRAVENOUS

## 2014-11-07 MED ORDER — SUCCINYLCHOLINE CHLORIDE 20 MG/ML IJ SOLN
INTRAMUSCULAR | Status: DC | PRN
  Administered 2014-11-07: 100 mg via INTRAVENOUS

## 2014-11-07 MED ORDER — SUCCINYLCHOLINE CHLORIDE 20 MG/ML IJ SOLN
INTRAMUSCULAR | Status: AC
  Filled 2014-11-07: qty 1

## 2014-11-07 MED ORDER — OXYCODONE HCL 5 MG PO TABS: 5.0000 mg | ORAL_TABLET | Freq: Once | ORAL | Status: DC | PRN

## 2014-11-07 MED ORDER — LACTATED RINGERS IV SOLN
INTRAVENOUS | Status: DC
  Administered 2014-11-07 (×2): via INTRAVENOUS

## 2014-11-07 MED ORDER — SODIUM CHLORIDE 0.9 % IR SOLN
Status: DC | PRN
  Administered 2014-11-07: 09:00:00

## 2014-11-07 MED ORDER — DEXAMETHASONE SODIUM PHOSPHATE 10 MG/ML IJ SOLN
INTRAMUSCULAR | Status: AC
  Filled 2014-11-07: qty 1

## 2014-11-07 MED ORDER — CYCLOBENZAPRINE HCL 10 MG PO TABS
ORAL_TABLET | ORAL | Status: AC
  Filled 2014-11-07: qty 1

## 2014-11-07 MED ORDER — DEXTROSE 5 % IV SOLN
3.0000 g | INTRAVENOUS | Status: AC
  Administered 2014-11-07: 3 g via INTRAVENOUS
  Filled 2014-11-07: qty 3000

## 2014-11-07 MED ORDER — DIPHENHYDRAMINE HCL 25 MG PO CAPS
25.0000 mg | ORAL_CAPSULE | Freq: Every evening | ORAL | Status: DC | PRN
Start: 2014-11-07 — End: 2014-11-08

## 2014-11-07 MED ORDER — LIDOCAINE HCL (CARDIAC) 20 MG/ML IV SOLN
INTRAVENOUS | Status: DC | PRN
  Administered 2014-11-07: 40 mg via INTRAVENOUS

## 2014-11-07 MED ORDER — EPHEDRINE SULFATE 50 MG/ML IJ SOLN
INTRAMUSCULAR | Status: AC
  Filled 2014-11-07: qty 1

## 2014-11-07 MED ORDER — OXYCODONE-ACETAMINOPHEN 5-325 MG PO TABS
1.0000 | ORAL_TABLET | ORAL | Status: DC | PRN
  Administered 2014-11-07 – 2014-11-08 (×5): 2 via ORAL
  Filled 2014-11-07 (×4): qty 2

## 2014-11-07 MED ORDER — TIZANIDINE HCL 4 MG PO TABS
4.0000 mg | ORAL_TABLET | Freq: Three times a day (TID) | ORAL | Status: DC
  Administered 2014-11-07 (×2): 4 mg via ORAL
  Filled 2014-11-07 (×5): qty 1

## 2014-11-07 MED ORDER — ALUM & MAG HYDROXIDE-SIMETH 200-200-20 MG/5ML PO SUSP: 30.0000 mL | Freq: Four times a day (QID) | ORAL | Status: DC | PRN

## 2014-11-07 MED ORDER — PROPOFOL 10 MG/ML IV BOLUS
INTRAVENOUS | Status: AC
  Filled 2014-11-07: qty 20

## 2014-11-07 MED ORDER — ACETAMINOPHEN 325 MG PO TABS
650.0000 mg | ORAL_TABLET | ORAL | Status: DC | PRN
Start: 2014-11-07 — End: 2014-11-08

## 2014-11-07 MED ORDER — CEFAZOLIN SODIUM 1-5 GM-% IV SOLN
1.0000 g | Freq: Three times a day (TID) | INTRAVENOUS | Status: AC
  Administered 2014-11-07 (×2): 1 g via INTRAVENOUS
  Filled 2014-11-07 (×2): qty 50

## 2014-11-07 MED ORDER — PHENOL 1.4 % MT LIQD: 1.0000 | OROMUCOSAL | Status: DC | PRN

## 2014-11-07 MED ORDER — LIDOCAINE HCL (CARDIAC) 20 MG/ML IV SOLN
INTRAVENOUS | Status: AC
  Filled 2014-11-07: qty 5

## 2014-11-07 MED ORDER — GLYCOPYRROLATE 0.2 MG/ML IJ SOLN
INTRAMUSCULAR | Status: DC | PRN
  Administered 2014-11-07: 0.4 mg via INTRAVENOUS

## 2014-11-07 MED ORDER — SODIUM CHLORIDE 0.9 % IV SOLN
10.0000 mg | INTRAVENOUS | Status: DC | PRN
  Administered 2014-11-07: 10 ug/min via INTRAVENOUS

## 2014-11-07 MED ORDER — CYCLOBENZAPRINE HCL 10 MG PO TABS
10.0000 mg | ORAL_TABLET | Freq: Three times a day (TID) | ORAL | Status: DC | PRN
  Administered 2014-11-08: 10 mg via ORAL
  Filled 2014-11-07 (×2): qty 1

## 2014-11-07 MED ORDER — OXYCODONE-ACETAMINOPHEN 10-325 MG PO TABS: 1.0000 | ORAL_TABLET | Freq: Four times a day (QID) | ORAL | Status: DC | PRN

## 2014-11-07 MED ORDER — PROMETHAZINE HCL 25 MG/ML IJ SOLN
INTRAMUSCULAR | Status: AC
  Administered 2014-11-07: 12.5 mg
  Filled 2014-11-07: qty 1

## 2014-11-07 MED ORDER — MENTHOL 3 MG MT LOZG: 1.0000 | LOZENGE | OROMUCOSAL | Status: DC | PRN

## 2014-11-07 MED ORDER — DEXAMETHASONE SODIUM PHOSPHATE 10 MG/ML IJ SOLN
10.0000 mg | Freq: Once | INTRAMUSCULAR | Status: AC
  Administered 2014-11-07: 10 mg via INTRAVENOUS

## 2014-11-07 MED ORDER — OXYCODONE HCL 5 MG/5ML PO SOLN: 5.0000 mg | Freq: Once | ORAL | Status: DC | PRN

## 2014-11-07 MED ORDER — HYDROMORPHONE HCL 1 MG/ML IJ SOLN: 0.2500 mg | INTRAMUSCULAR | Status: DC | PRN

## 2014-11-07 MED ORDER — DOXYLAMINE SUCCINATE (SLEEP) 25 MG PO TABS: 25.0000 mg | ORAL_TABLET | Freq: Every evening | ORAL | Status: DC | PRN

## 2014-11-07 MED ORDER — THROMBIN 20000 UNITS EX SOLR
CUTANEOUS | Status: DC | PRN
  Administered 2014-11-07: 09:00:00 via TOPICAL

## 2014-11-07 SURGICAL SUPPLY — 86 items
APL SKNCLS STERI-STRIP NONHPOA (GAUZE/BANDAGES/DRESSINGS) ×1
BAG DECANTER FOR FLEXI CONT (MISCELLANEOUS) ×3 IMPLANT
BENZOIN TINCTURE PRP APPL 2/3 (GAUZE/BANDAGES/DRESSINGS) ×3 IMPLANT
BONE ALLOSTEM CUBE 1CC/10MM3 (Bone Implant) ×2 IMPLANT
BRUSH SCRUB EZ PLAIN DRY (MISCELLANEOUS) ×3 IMPLANT
BUR MATCHSTICK NEURO 3.0 LAGG (BURR) ×3 IMPLANT
CANISTER SUCT 3000ML (MISCELLANEOUS) ×3 IMPLANT
CLOSURE WOUND 1/2 X4 (GAUZE/BANDAGES/DRESSINGS) ×1
CONT SPEC 4OZ CLIKSEAL STRL BL (MISCELLANEOUS) ×3 IMPLANT
DECANTER SPIKE VIAL GLASS SM (MISCELLANEOUS) ×3 IMPLANT
DRAIN SNY WOU 7FLT (WOUND CARE) ×2 IMPLANT
DRAPE C-ARM 42X72 X-RAY (DRAPES) ×6 IMPLANT
DRAPE LAPAROTOMY 100X72 PEDS (DRAPES) ×3 IMPLANT
DRAPE MICROSCOPE LEICA (MISCELLANEOUS) ×3 IMPLANT
DRAPE POUCH INSTRU U-SHP 10X18 (DRAPES) ×3 IMPLANT
DRSG OPSITE POSTOP 4X6 (GAUZE/BANDAGES/DRESSINGS) ×3 IMPLANT
DRSG OPSITE POSTOP 4X8 (GAUZE/BANDAGES/DRESSINGS) ×2 IMPLANT
DURAPREP 6ML APPLICATOR 50/CS (WOUND CARE) ×3 IMPLANT
ELECT COATED BLADE 2.86 ST (ELECTRODE) ×3 IMPLANT
ELECT REM PT RETURN 9FT ADLT (ELECTROSURGICAL) ×3
ELECTRODE REM PT RTRN 9FT ADLT (ELECTROSURGICAL) ×1 IMPLANT
GAUZE SPONGE 4X4 12PLY STRL (GAUZE/BANDAGES/DRESSINGS) ×3 IMPLANT
GAUZE SPONGE 4X4 16PLY XRAY LF (GAUZE/BANDAGES/DRESSINGS) IMPLANT
GLOVE BIO SURGEON STRL SZ 6.5 (GLOVE) IMPLANT
GLOVE BIO SURGEON STRL SZ7 (GLOVE) IMPLANT
GLOVE BIO SURGEON STRL SZ7.5 (GLOVE) IMPLANT
GLOVE BIO SURGEON STRL SZ8 (GLOVE) ×3 IMPLANT
GLOVE BIO SURGEON STRL SZ8.5 (GLOVE) IMPLANT
GLOVE BIO SURGEONS STRL SZ 6.5 (GLOVE)
GLOVE BIOGEL M 8.0 STRL (GLOVE) IMPLANT
GLOVE BIOGEL PI IND STRL 7.5 (GLOVE) IMPLANT
GLOVE BIOGEL PI INDICATOR 7.5 (GLOVE) ×2
GLOVE ECLIPSE 6.5 STRL STRAW (GLOVE) IMPLANT
GLOVE ECLIPSE 7.0 STRL STRAW (GLOVE) IMPLANT
GLOVE ECLIPSE 7.5 STRL STRAW (GLOVE) IMPLANT
GLOVE ECLIPSE 8.0 STRL XLNG CF (GLOVE) IMPLANT
GLOVE ECLIPSE 8.5 STRL (GLOVE) IMPLANT
GLOVE EXAM NITRILE LRG STRL (GLOVE) IMPLANT
GLOVE EXAM NITRILE MD LF STRL (GLOVE) IMPLANT
GLOVE EXAM NITRILE XL STR (GLOVE) IMPLANT
GLOVE EXAM NITRILE XS STR PU (GLOVE) IMPLANT
GLOVE INDICATOR 6.5 STRL GRN (GLOVE) IMPLANT
GLOVE INDICATOR 7.0 STRL GRN (GLOVE) IMPLANT
GLOVE INDICATOR 7.5 STRL GRN (GLOVE) IMPLANT
GLOVE INDICATOR 8.0 STRL GRN (GLOVE) IMPLANT
GLOVE INDICATOR 8.5 STRL (GLOVE) ×3 IMPLANT
GLOVE OPTIFIT SS 8.0 STRL (GLOVE) IMPLANT
GLOVE SS N UNI LF 7.0 STRL (GLOVE) ×6 IMPLANT
GLOVE SURG SS PI 6.5 STRL IVOR (GLOVE) IMPLANT
GOWN STRL REUS W/ TWL LRG LVL3 (GOWN DISPOSABLE) ×1 IMPLANT
GOWN STRL REUS W/ TWL XL LVL3 (GOWN DISPOSABLE) ×1 IMPLANT
GOWN STRL REUS W/TWL 2XL LVL3 (GOWN DISPOSABLE) ×3 IMPLANT
GOWN STRL REUS W/TWL LRG LVL3 (GOWN DISPOSABLE) ×3
GOWN STRL REUS W/TWL XL LVL3 (GOWN DISPOSABLE) ×3
HALTER HD/CHIN CERV TRACTION D (MISCELLANEOUS) ×3 IMPLANT
HEMOSTAT POWDER KIT SURGIFOAM (HEMOSTASIS) ×4 IMPLANT
KIT BASIN OR (CUSTOM PROCEDURE TRAY) ×3 IMPLANT
KIT ROOM TURNOVER OR (KITS) ×3 IMPLANT
LIQUID BAND (GAUZE/BANDAGES/DRESSINGS) ×2 IMPLANT
NDL HYPO 18GX1.5 BLUNT FILL (NEEDLE) ×1 IMPLANT
NDL SPNL 20GX3.5 QUINCKE YW (NEEDLE) ×1 IMPLANT
NEEDLE HYPO 18GX1.5 BLUNT FILL (NEEDLE) ×3 IMPLANT
NEEDLE SPNL 20GX3.5 QUINCKE YW (NEEDLE) ×3 IMPLANT
NS IRRIG 1000ML POUR BTL (IV SOLUTION) ×3 IMPLANT
PACK LAMINECTOMY NEURO (CUSTOM PROCEDURE TRAY) ×3 IMPLANT
PIN DISTRACTION 14MM (PIN) ×4 IMPLANT
PLATE ANT CERV XTEND 1 LV 18 (Plate) ×2 IMPLANT
PLATE ANT CERV XTEND 2 LV 32 (Plate) ×2 IMPLANT
RUBBERBAND STERILE (MISCELLANEOUS) ×6 IMPLANT
SCREW SELF TAP VARIABLE 4.6X12 (Screw) ×4 IMPLANT
SCREW XTD VAR 4.2 SELF TAP (Screw) ×16 IMPLANT
SPACER CERV FRGE 12X14X5-0 (Spacer) ×2 IMPLANT
SPACER CERV FRGE 12X14X6-0 (Spacer) ×2 IMPLANT
SPACER CERVICAL FRGE 12X14X8-0 (Spacer) ×2 IMPLANT
SPONGE INTESTINAL PEANUT (DISPOSABLE) ×3 IMPLANT
SPONGE SURGIFOAM ABS GEL 100 (HEMOSTASIS) ×3 IMPLANT
SPONGE SURGIFOAM ABS GEL SZ50 (HEMOSTASIS) IMPLANT
STRIP CLOSURE SKIN 1/2X4 (GAUZE/BANDAGES/DRESSINGS) ×2 IMPLANT
SUT VIC AB 3-0 SH 8-18 (SUTURE) ×3 IMPLANT
SUT VICRYL 4-0 PS2 18IN ABS (SUTURE) ×3 IMPLANT
SYR 20ML ECCENTRIC (SYRINGE) ×3 IMPLANT
TAPE CLOTH 4X10 WHT NS (GAUZE/BANDAGES/DRESSINGS) ×2 IMPLANT
TOWEL OR 17X24 6PK STRL BLUE (TOWEL DISPOSABLE) ×3 IMPLANT
TOWEL OR 17X26 10 PK STRL BLUE (TOWEL DISPOSABLE) ×3 IMPLANT
TRAP SPECIMEN MUCOUS 40CC (MISCELLANEOUS) ×3 IMPLANT
WATER STERILE IRR 1000ML POUR (IV SOLUTION) ×3 IMPLANT

## 2014-11-07 NOTE — Transfer of Care (Signed)
Immediate Anesthesia Transfer of Care Note  Patient: Julie [name]  Procedure(s) Performed: Procedure(s): ANTERIOR CERVICAL DECOMPRESSION/DISCECTOMY FUSION THREE-FOUR ,FOUR-FIVE,SIX SEVEN /HARDWARE REMOVAL FIVE-SIX (N/A)  Patient Location: PACU  Anesthesia Type:General  Level of Consciousness: awake, alert  and oriented  Airway & Oxygen Therapy: Patient Spontanous Breathing and Patient connected to nasal cannula oxygen  Post-op Assessment: Report given to PACU RN, Post -op Vital signs reviewed and stable and Patient moving all extremities X 4  Post vital signs: Reviewed and stable  Complications: No apparent anesthesia complications

## 2014-11-07 NOTE — Anesthesia Postprocedure Evaluation (Signed)
  Anesthesia Post-op Note  Patient: Julie [name]  Procedure(s) Performed: Procedure(s): ANTERIOR CERVICAL DECOMPRESSION/DISCECTOMY FUSION THREE-FOUR ,FOUR-FIVE,SIX SEVEN /HARDWARE REMOVAL FIVE-SIX (N/A)  Patient Location: PACU  Anesthesia Type:General  Level of Consciousness: awake and alert   Airway and Oxygen Therapy: Patient Spontanous Breathing  Post-op Pain: mild  Post-op Assessment: Post-op Vital signs reviewed  Post-op Vital Signs: stable  Last Vitals:  Filed Vitals:   11/07/14 1402  BP: 136/90  Pulse: 57  Temp: 36.4 C  Resp: 22    Complications: No apparent anesthesia complications

## 2014-11-07 NOTE — Progress Notes (Signed)
Orthopedic Tech Progress Note Patient Details:  Julie PallShannon P [name] 1976/05/07 [id number]  Ortho Devices Type of Ortho Device: Soft collar Ortho Device/Splint Location: neck Ortho Device/Splint Interventions: Application As ordered by Dr. Chalmers Guestram  Ingvald Theisen 11/07/2014, 10:46 AM

## 2014-11-07 NOTE — Progress Notes (Signed)
Orthopedic Tech Progress Note Patient Details:  Julie [name] [birth year] [id number] Applied soft cervical collar. Ortho Devices Type of Ortho Device: Soft collar Ortho Device/Splint Location: neck Ortho Device/Splint Interventions: Application   Lesle ChrisGilliland, Jalik Gellatly L 11/07/2014, 2:35 PM

## 2014-11-07 NOTE — Anesthesia Procedure Notes (Signed)
Procedure Name: Intubation Date/Time: 11/07/2014 9:21 AM Performed by: Quentin OreWALKER, Albeiro Trompeter E Pre-anesthesia Checklist: Patient identified, Emergency Drugs available, Suction available, Patient being monitored and Timeout performed Patient Re-evaluated:Patient Re-evaluated prior to inductionOxygen Delivery Method: Circle system utilized Preoxygenation: Pre-oxygenation with 100% oxygen Intubation Type: IV induction Ventilation: Mask ventilation without difficulty Laryngoscope Size: Mac and 4 Grade View: Grade I Tube type: Oral Tube size: 7.0 mm Number of attempts: 1 Airway Equipment and Method: Stylet Placement Confirmation: ETT inserted through vocal cords under direct vision,  positive ETCO2 and breath sounds checked- equal and bilateral Secured at: 22 cm Tube secured with: Tape Dental Injury: Teeth and Oropharynx as per pre-operative assessment

## 2014-11-07 NOTE — H&P (Signed)
Julie [name] is an 38 y.o. female.   Chief Complaint: Neck and bilateral shoulder and arm pain HPI: Patient is a very pleasant 38 year old female is a progress worsening neck pain and pain in both shoulders and arms with numbness tingling last 2 fingers of both hands. Patient refractory to all forms of conservative treatment over the years with physical therapy anti-inflammatories. Workup revealed severe cervical spondylosis and stenosis with radiculopathy at C4-5 C5-6 and C6-7 with a previous solid fusion at C5-6. I extensively went over the risks and benefits of the operation with the patient as well as perioperative course expectations of outcome and alternatives of surgery and she understands and agrees to proceed forward.  Past Medical History  Diagnosis Date  . Hypertension   . Morbid obesity   . Peptic ulcer disease     h. polyri 2003  . GERD (gastroesophageal reflux disease)   . IBS (irritable bowel syndrome)   . Cervical disc disease   . Furuncle     recurrent   . Insomnia   . Bronchitis   . Pneumonia     hx of  . Hypothyroidism     Past Surgical History  Procedure Laterality Date  . Tonsillectomy  2006  . Cervical spine surgery    . Carpal tunnel release Left   . Mouth surgery      Family History  Problem Relation Age of Onset  . Asthma Mother   . Hypertension Mother   . Hypothyroidism Mother   . Hypertension Father   . Restless legs syndrome Father   . Sleep apnea Father   . Colon polyps Father    Social History:  reports that she has been smoking Cigarettes.  She has a 7.5 pack-year smoking history. She has never used smokeless tobacco. She reports that she does not drink alcohol or use illicit drugs.  Allergies:  Allergies  Allergen Reactions  . Nsaids Other (See Comments)    Stomach ulcer  . Clarithromycin Nausea And Vomiting  . Dicyclomine Hcl Nausea And Vomiting  . Lisinopril Palpitations  . Prednisone Palpitations  . Sulfonamide Derivatives  Nausea And Vomiting    Medications Prior to Admission  Medication Sig Dispense Refill  . carvedilol (COREG) 12.5 MG tablet Take 12.5 mg by mouth 2 (two) times daily with a meal.    . doxylamine, Sleep, (UNISOM) 25 MG tablet Take 25 mg by mouth at bedtime as needed for sleep.     Marland Kitchen. GRALISE 600 MG TABS Take 1,200 mg by mouth at bedtime.     Marland Kitchen. levothyroxine (SYNTHROID, LEVOTHROID) 50 MCG tablet Take 50 mcg by mouth daily before breakfast.    . oxyCODONE-acetaminophen (PERCOCET) 10-325 MG per tablet Take 1 tablet by mouth every 6 (six) hours as needed for pain.    Marland Kitchen. tiZANidine (ZANAFLEX) 4 MG capsule Take 4 mg by mouth 3 (three) times daily.     Marland Kitchen. CAMBIA 50 MG PACK 1 packet daily as needed.  0  . lidocaine (LMX) 4 % cream Apply 1 application topically as needed (neck pain).      No results found for this or any previous visit (from the past 48 hour(s)). No results found.  Review of Systems  Constitutional: Positive for fever.  Eyes: Negative.   Respiratory: Negative.   Cardiovascular: Negative.   Gastrointestinal: Negative.   Genitourinary: Negative.   Musculoskeletal: Positive for myalgias and neck pain.  Skin: Negative.   Neurological: Positive for tingling, sensory change and headaches.  Psychiatric/Behavioral:  Negative.     Blood pressure 115/58, pulse 73, temperature 97.5 F (36.4 C), temperature source Oral, resp. rate 18, height 5\' 6"  (1.676 m), weight 129.275 kg (285 lb), last menstrual period 10/14/2014, SpO2 97 %. Physical Exam  Constitutional: She is oriented to person, place, and time. She appears well-developed and well-nourished.  Eyes: Pupils are equal, round, and reactive to light.  Neck: Normal range of motion.  Respiratory: Effort normal.  GI: Soft.  Neurological: She is alert and oriented to person, place, and time. She has normal strength. GCS eye subscore is 4. GCS verbal subscore is 5. GCS motor subscore is 6.  Patient is awake and alert strength is 5 out of  5 in her upper extremities including deltoid, bicep, tricep, wrist flexion, wrist extension, hand intrinsics.  Skin: Skin is warm and dry.     Assessment/Plan 38 revealed presents for an ACDF at C3-4 C4-5 and C6-7. With removal of hardware C5-6.  Lauryl Seyer P 11/07/2014, 8:52 AM

## 2014-11-07 NOTE — Anesthesia Preprocedure Evaluation (Addendum)
Anesthesia Evaluation  Patient identified by MRN, date of birth, ID band Patient awake    Reviewed: Allergy & Precautions, H&P , NPO status , Patient's Chart, lab work & pertinent test results  History of Anesthesia Complications Negative for: history of anesthetic complications  Airway Mallampati: II   Neck ROM: Full    Dental  (+) Teeth Intact   Pulmonary Current Smoker,  breath sounds clear to auscultation        Cardiovascular hypertension, Rhythm:Regular Rate:Normal     Neuro/Psych    GI/Hepatic PUD, GERD-  ,  Endo/Other  Hypothyroidism Morbid obesity  Renal/GU      Musculoskeletal  (+) Arthritis -,   Abdominal   Peds  Hematology   Anesthesia Other Findings   Reproductive/Obstetrics                           Anesthesia Physical Anesthesia Plan  ASA: II  Anesthesia Plan: General   Post-op Pain Management:    Induction: Intravenous  Airway Management Planned: Oral ETT  Additional Equipment:   Intra-op Plan:   Post-operative Plan: Extubation in OR  Informed Consent: I have reviewed the patients History and Physical, chart, labs and discussed the procedure including the risks, benefits and alternatives for the proposed anesthesia with the patient or authorized representative who has indicated his/her understanding and acceptance.   Dental advisory given  Plan Discussed with: CRNA and Surgeon  Anesthesia Plan Comments:        Anesthesia Quick Evaluation

## 2014-11-07 NOTE — Op Note (Signed)
Preoperative diagnosis: Cervical spondylosis with stenosis neck pain and radiculopathy at C3-4 C4-5 and C6-7.  Postoperative diagnosis: Same  Procedure: Anterior cervical discectomies and fusion at C3-4, C4-5, C6-7. With an expiration of fusion removal of hardware C5-6 area utilizing allograft spacers and the globus extend plating system with a two-level plate from Z6-X0C3-C5 and a 1 level plate at C6 C6 7  Surgeon: Jillyn HiddenGary Servando Kyllonen  Asst.: Coletta MemosKyle Cabbell  Anesthesia: Gen.  EBL: Minimal  History of present illness: Patient is a very pleasant 38 year old female is a progress worsening neck pain bilateral shoulder and arm pain with numbness tingling in last 2 fingers of both hands. Patient's failed all forms of conservative treatment with physical therapy anti-inflammatories and narcotic pain management over time in September is worse over last several years. Workup revealed severe severe spondylosis degenerative disc disease and stenosis at C3-4, C4-5, C6-7 and due to failure conservative treatment imaging findings and progressive conical syndrome I recommended to her cervical discectomies and fusion at those 3 levels with removal of hardware at C5-6. I extensively went over risks and benefits of the operation with the patient as well as perioperative course and expectations of outcome and alternatives surgery and she understood and agreed to proceed forward.  Operative procedure: Patient brought into the or was induced under general anesthesia positioned supine the neck in slight extension in 5 pounds of halter traction the left side of her neck was prepped and draped in routine sterile fashion her old incision was identified and a curvilinear incision was made just off midline to the antebrachial the sternomastoid and the superficial layer of the platysmas dissected and divided longitudinally the avascular plane to sternomastoid and strap muscles was developed down to the prevertebral fascia. The prevertebral  fascia was dissected away with Kitners. The plate was immediately identified and the lungs close and reflected laterally sever tenuous factors placed initially at the disc space at C3-4 and C4-5. Large anterior aspect of did not Leksell rongeur and a 3 minute Kerrison punch. Both the space were drilled down the posterior annulus aspect complex. Under my scope coordination aggressive abutting both endplates at C3-4 was carried out the PLL was identified and removed in piecemeal fashion. Marching laterally the C4 pedicles were identified C4 nerve roots, social pedicle large posterior aspect her aggressively under bitten in the endplates. This was then measured out and a 5 mm allograft wedges inserted here tensions and taken the C4-5 and a similar fashion C4-5 was drilled down aggressively under biting both endplates large posterior spur predominantly coming off the 5 vertebral body was aggressively under been decompress the central canal both C5 nerve roots were skeletonized flush with pedicle. A 6 mm allograft wedge was inserted here. Then the retractor was repositioned for C6-7-6 7 was a markedly collapsed and degenerated this was opened up and drilled down decompressed aggressive abutting both endplates decompress the central canal both C7 medical pedicles were identified both C7 nerve roots were skeletonized flush with pedicle. I placed an 8 mm allograft wedge here. Prior to repositioning the retractor I placed a 32 mm globus extend plate all screws excellent purchase utilizing 2 rescue screws and the vertebral body of C5. Locking mechanism was engaged. After completion of the 67 discectomy I placed a 18 mm plate utilizing 2 rescue screws and the body of C6. Locking mechanisms were engaged. The wound scope was irrigated fixing space was maintained a drain was placed was closed in layers with interrupted Vicryl and the  platysma and a running 4 subcuticular in the skin to bond benzoin Steri-Strips and sterile  dressing were applied patient recovered in stable condition. At the end of case all needle counts sponge counts were correct.

## 2014-11-08 DIAGNOSIS — M47892 Other spondylosis, cervical region: Secondary | ICD-10-CM | POA: Diagnosis not present

## 2014-11-08 MED ORDER — OXYCODONE HCL 15 MG PO TABS: 15.0000 mg | ORAL_TABLET | ORAL | Status: DC | PRN

## 2014-11-08 NOTE — Progress Notes (Signed)
JP drain accidentally pulled out by patient, no active bleeding noted, no swelling noted on incision site. The bottom part of Honeycomb drsg is soaked with bloody drainage, reinforced with 4x4 drsg. Will continue to monitor.

## 2014-11-08 NOTE — Progress Notes (Signed)
Patient ID: Julie [name], female   DOB: 1976/07/25, 38 y.o.   MRN: [id number] Doing well no arm pain discharged home

## 2014-11-08 NOTE — Discharge Instructions (Signed)

## 2014-11-08 NOTE — Discharge Summary (Signed)
  Physician Discharge Summary  Patient ID: Julie [name] MRN: [id number] DOB/AGE: 04/21/1976 38 y.o.  AdmiBarnet Pallt date: 11/07/2014 Discharge date: 11/08/2014  Admission Diagnoses: Cervical spondylosis with stenosis C3-4 C4-5 C6-7  Discharge Diagnoses: Same Active Problems:   Spinal stenosis of cervical region   Discharged Condition: good  Hospital Course: She is admitted hospital underwent an ACDF at C3-4, C5-6, C6-7 patient did very well recovered on the floor on the floor she was ambulating and voiding spontaneously tolerating regular diet and pain was well controlled on pills.  Consults: Significant Diagnostic Studies: Treatments: ACDF C3-4, C4-5, C6-7. Removal of hardware C5-6 Discharge Exam: Blood pressure 126/73, pulse 69, temperature 97.9 F (36.6 C), temperature source Oral, resp. rate 18, height 5\' 6"  (1.676 m), weight 129.275 kg (285 lb), last menstrual period 10/14/2014, SpO2 97 %. Strength out of 5 wound clean dry and intact  Disposition: Home     Medication List    TAKE these medications        CAMBIA 50 MG Pack  Generic drug:  Diclofenac Potassium  1 packet daily as needed.     carvedilol 12.5 MG tablet  Commonly known as:  COREG  Take 12.5 mg by mouth 2 (two) times daily with a meal.     doxylamine (Sleep) 25 MG tablet  Commonly known as:  UNISOM  Take 25 mg by mouth at bedtime as needed for sleep.     GRALISE 600 MG Tabs  Generic drug:  Gabapentin (Once-Daily)  Take 1,200 mg by mouth at bedtime.     levothyroxine 50 MCG tablet  Commonly known as:  SYNTHROID, LEVOTHROID  Take 50 mcg by mouth daily before breakfast.     lidocaine 4 % cream  Commonly known as:  LMX  Apply 1 application topically as needed (neck pain).     oxyCODONE 15 MG immediate release tablet  Commonly known as:  ROXICODONE  Take 1 tablet (15 mg total) by mouth every 4 (four) hours as needed for severe pain.     oxyCODONE-acetaminophen 10-325 MG per tablet  Commonly known  as:  PERCOCET  Take 1 tablet by mouth every 6 (six) hours as needed for pain.     tiZANidine 4 MG capsule  Commonly known as:  ZANAFLEX  Take 4 mg by mouth 3 (three) times daily.           Follow-up Information    [name], MD.   Specialty:  Neurosurgery   Contact information:   [address] [place] [address] [phone]       Signed: Devonia Farro P 11/08/2014, 7:20 AM

## 2014-11-08 NOTE — Progress Notes (Signed)
Patient alert and oriented, mae's well, voiding adequate amount of urine, swallowing without difficulty, no c/o pain. Patient discharged home with family. Script and discharged instructions given to patient. Patient and family stated understanding of d/c instructions given and has an appointment with MD. Aisha Taveon Enyeart RN 

## 2014-11-09 ENCOUNTER — Encounter (HOSPITAL_COMMUNITY): Payer: Self-pay | Admitting: Neurosurgery

## 2014-11-12 ENCOUNTER — Encounter (HOSPITAL_COMMUNITY): Payer: Self-pay | Admitting: Neurosurgery

## 2014-11-16 ENCOUNTER — Other Ambulatory Visit: Payer: Self-pay | Admitting: Internal Medicine

## 2014-12-20 ENCOUNTER — Other Ambulatory Visit: Payer: Self-pay | Admitting: Internal Medicine

## 2014-12-21 ENCOUNTER — Telehealth: Payer: Self-pay | Admitting: Internal Medicine

## 2014-12-21 MED ORDER — CARVEDILOL 12.5 MG PO TABS: 12.5000 mg | ORAL_TABLET | Freq: Two times a day (BID) | ORAL | Status: DC

## 2014-12-21 NOTE — Telephone Encounter (Signed)
Left detailed message Rx sent to pharmacy as requested. 

## 2014-12-21 NOTE — Telephone Encounter (Signed)
Pt has sch an appt for 12-25-14. Pt needs refill on carvedilol [facility] [place] [facility]

## 2014-12-25 ENCOUNTER — Encounter: Payer: Self-pay | Admitting: Internal Medicine

## 2014-12-25 ENCOUNTER — Other Ambulatory Visit: Payer: BLUE CROSS/BLUE SHIELD

## 2014-12-25 ENCOUNTER — Ambulatory Visit (INDEPENDENT_AMBULATORY_CARE_PROVIDER_SITE_OTHER): Payer: BLUE CROSS/BLUE SHIELD | Admitting: Internal Medicine

## 2014-12-25 VITALS — BP 120/80 | HR 71 | Temp 98.1°F | Resp 20 | Ht 66.0 in | Wt 275.0 lb

## 2014-12-25 DIAGNOSIS — Z0000 Encounter for general adult medical examination without abnormal findings: Secondary | ICD-10-CM

## 2014-12-25 NOTE — Patient Instructions (Signed)

## 2014-12-25 NOTE — Progress Notes (Signed)
Pre visit review using our clinic review tool, if applicable. No additional management support is needed unless otherwise documented below in the visit note. 

## 2015-01-02 ENCOUNTER — Telehealth: Payer: Self-pay | Admitting: *Deleted

## 2015-01-02 NOTE — Telephone Encounter (Signed)
Left a message. Patient needs to come back and have her labs redrawn. [facility] did not process them.

## 2015-01-03 ENCOUNTER — Other Ambulatory Visit: Payer: Self-pay | Admitting: *Deleted

## 2015-01-03 DIAGNOSIS — Z0000 Encounter for general adult medical examination without abnormal findings: Secondary | ICD-10-CM

## 2015-01-08 ENCOUNTER — Other Ambulatory Visit: Payer: BLUE CROSS/BLUE SHIELD

## 2015-01-08 DIAGNOSIS — Z0000 Encounter for general adult medical examination without abnormal findings: Secondary | ICD-10-CM

## 2015-01-08 LAB — LIPID PANEL
Cholesterol: 213 mg/dL — ABNORMAL HIGH (ref 0–200)
HDL: 61.3 mg/dL (ref >39.00)
LDL Cholesterol: 128 mg/dL — ABNORMAL HIGH (ref 0–99)
NONHDL: 151.7
Total CHOL/HDL Ratio: 3
Triglycerides: 119 mg/dL (ref 0.0–149.0)
VLDL: 23.8 mg/dL (ref 0.0–40.0)

## 2015-01-08 LAB — TSH: TSH: 3.95 u[IU]/mL (ref 0.35–4.50)

## 2015-01-17 ENCOUNTER — Other Ambulatory Visit: Payer: Self-pay | Admitting: Internal Medicine

## 2015-01-22 ENCOUNTER — Telehealth: Payer: Self-pay | Admitting: Internal Medicine

## 2015-01-22 NOTE — Telephone Encounter (Signed)
Spoke to pt, told her Cholesterol and LDL slightly elevated and TSH was normal. Pt verbalized understanding.

## 2015-01-22 NOTE — Telephone Encounter (Signed)
Pt would like blood work results °

## 2015-02-16 ENCOUNTER — Other Ambulatory Visit: Payer: Self-pay | Admitting: Internal Medicine

## 2015-05-19 ENCOUNTER — Other Ambulatory Visit: Payer: Self-pay | Admitting: Internal Medicine

## 2015-06-21 ENCOUNTER — Other Ambulatory Visit: Payer: Self-pay | Admitting: Internal Medicine

## 2015-08-08 ENCOUNTER — Other Ambulatory Visit: Payer: Self-pay | Admitting: Neurosurgery

## 2015-08-26 ENCOUNTER — Ambulatory Visit: Payer: BLUE CROSS/BLUE SHIELD | Admitting: Internal Medicine

## 2015-10-08 NOTE — Pre-Procedure Instructions (Signed)
Julie [name]  10/08/2015      [name] [place], [name] - [address] [place] [address] Phone: [phone] Fax: [phone]  [name] [place], [place] - [address] [facility] [address] [name] [address] Phone: [phone] Fax: [phone]  [id number] [place], [place] - [name] [address] Phone: [phone] Fax: [phone]    Your procedure is scheduled on Fri, Nov 11   [name] Admitting at 5:30 AM.  Call this number if you have problems the morning of surgery:  [phone]   Remember:  Do not eat food or drink liquids after midnight.  Take these medicines the morning of surgery with A SIP OF WATER Carvedilol(Coreg),Synthroid(Levothyroxine),and Pain Pill(if needed)              No Goody's,BC's,Aleve,Aspirin,Ibuprofen,Fish Oil,or any Herbal Medications.,also cambia stop 10/13/15   Do not wear jewelry, make-up or nail polish.  Do not wear lotions, powders, or perfumes.  You may wear deodorant.  Do not shave 48 hours prior to surgery.    Do not bring valuables to the [name].  [name] is not responsible for any belongings or valuables.  Contacts, dentures or bridgework may not be worn into surgery.  Leave your suitcase in the car.  After surgery it may be brought to your room.  For patients admitted to the [name], discharge time will be determined by your treatment team.  Patients discharged the day of surgery will not be allowed to drive home.    Special instructions:  [name] - Preparing for Surgery  Before surgery, you can play an important role.  Because skin is not sterile, your skin needs to be as free of germs as possible.  You can reduce the number of germs on you skin by washing with CHG (chlorahexidine gluconate) soap before surgery.  CHG is an antiseptic cleaner which kills germs and  bonds with the skin to continue killing germs even after washing.  Please DO NOT use if you have an allergy to CHG or antibacterial soaps.  If your skin becomes reddened/irritated stop using the CHG and inform your nurse when you arrive at Short Stay.  Do not shave (including legs and underarms) for at least 48 hours prior to the first CHG shower.  You may shave your face.  Please follow these instructions carefully:   1.  Shower with CHG Soap the night before surgery and the                                morning of Surgery.  2.  If you choose to wash your hair, wash your hair first as usual with your       normal shampoo.  3.  After you shampoo, rinse your hair and body thoroughly to remove the                      Shampoo.  4.  Use CHG as you would any other liquid soap.  You can apply chg directly       to the skin and wash gently with scrungie or a clean washcloth.  5.  Apply the CHG Soap to your body ONLY FROM THE NECK DOWN.        Do not use on open  wounds or open sores.  Avoid contact with your eyes,       ears, mouth and genitals (private parts).  Wash genitals (private parts)       with your normal soap.  6.  Wash thoroughly, paying special attention to the area where your surgery        will be performed.  7.  Thoroughly rinse your body with warm water from the neck down.  8.  DO NOT shower/wash with your normal soap after using and rinsing off       the CHG Soap.  9.  Pat yourself dry with a clean towel.            10.  Wear clean pajamas.            11.  Place clean sheets on your bed the night of your first shower and do not        sleep with pets.  Day of Surgery  Do not apply any lotions/deoderants the morning of surgery.  Please wear clean clothes to the [name]/surgery [name].    Please read over the following fact sheets that you were given. Pain Booklet, Coughing and Deep Breathing, MRSA Information and Surgical Site Infection Prevention

## 2015-10-09 ENCOUNTER — Encounter (HOSPITAL_COMMUNITY)
Admission: RE | Admit: 2015-10-09 | Discharge: 2015-10-09 | Disposition: A | Discharge status: Home / Self Care | Payer: BLUE CROSS/BLUE SHIELD | Source: Ambulatory Visit | Attending: Neurosurgery | Admitting: Neurosurgery

## 2015-10-09 ENCOUNTER — Encounter (HOSPITAL_COMMUNITY): Payer: Self-pay

## 2015-10-09 DIAGNOSIS — Z01812 Encounter for preprocedural laboratory examination: Secondary | ICD-10-CM | POA: Insufficient documentation

## 2015-10-09 HISTORY — DX: Headache, unspecified: R51.9

## 2015-10-09 HISTORY — DX: Unspecified osteoarthritis, unspecified site: M19.90

## 2015-10-09 HISTORY — DX: Headache: R51

## 2015-10-09 LAB — BASIC METABOLIC PANEL
ANION GAP: 11 (ref 5–15)
BUN: <5 mg/dL — ABNORMAL LOW (ref 6–20)
CO2: 21 mmol/L — ABNORMAL LOW (ref 22–32)
Calcium: 9.5 mg/dL (ref 8.9–10.3)
Chloride: 110 mmol/L (ref 101–111)
Creatinine, Ser: 0.71 mg/dL (ref 0.44–1.00)
GLUCOSE: 88 mg/dL (ref 65–99)
POTASSIUM: 3.9 mmol/L (ref 3.5–5.1)
SODIUM: 142 mmol/L (ref 135–145)

## 2015-10-09 LAB — CBC
HEMATOCRIT: 47.5 % — AB (ref 36.0–46.0)
HEMOGLOBIN: 16.3 g/dL — AB (ref 12.0–15.0)
MCH: 31.8 pg (ref 26.0–34.0)
MCHC: 34.3 g/dL (ref 30.0–36.0)
MCV: 92.6 fL (ref 78.0–100.0)
Platelets: 354 10*3/uL (ref 150–400)
RBC: 5.13 MIL/uL — AB (ref 3.87–5.11)
RDW: 13 % (ref 11.5–15.5)
WBC: 8.9 10*3/uL (ref 4.0–10.5)

## 2015-10-09 LAB — SURGICAL PCR SCREEN
MRSA, PCR: NEGATIVE
STAPHYLOCOCCUS AUREUS: NEGATIVE

## 2015-10-09 LAB — HCG, SERUM, QUALITATIVE: PREG SERUM: NEGATIVE

## 2015-10-17 MED ORDER — CEFAZOLIN SODIUM-DEXTROSE 2-3 GM-% IV SOLR
2.0000 g | INTRAVENOUS | Status: AC
  Administered 2015-10-18: 2 g via INTRAVENOUS
  Filled 2015-10-17 (×2): qty 50

## 2015-10-18 ENCOUNTER — Inpatient Hospital Stay (HOSPITAL_COMMUNITY): Payer: BLUE CROSS/BLUE SHIELD

## 2015-10-18 ENCOUNTER — Encounter (HOSPITAL_COMMUNITY): Payer: Self-pay | Admitting: General Practice

## 2015-10-18 ENCOUNTER — Inpatient Hospital Stay (HOSPITAL_COMMUNITY): Payer: BLUE CROSS/BLUE SHIELD | Admitting: Certified Registered Nurse Anesthetist

## 2015-10-18 ENCOUNTER — Encounter (HOSPITAL_COMMUNITY)
Admission: RE | Disposition: A | Discharge status: Home / Self Care | Payer: Self-pay | Source: Ambulatory Visit | Attending: Neurosurgery

## 2015-10-18 ENCOUNTER — Inpatient Hospital Stay (HOSPITAL_COMMUNITY)
Admission: RE | Admit: 2015-10-18 | Discharge: 2015-10-21 | DRG: 472 | Disposition: A | Discharge status: Home / Self Care | Payer: BLUE CROSS/BLUE SHIELD | Source: Ambulatory Visit | Attending: Neurosurgery | Admitting: Neurosurgery

## 2015-10-18 DIAGNOSIS — M542 Cervicalgia: Secondary | ICD-10-CM | POA: Diagnosis present

## 2015-10-18 DIAGNOSIS — K589 Irritable bowel syndrome without diarrhea: Secondary | ICD-10-CM | POA: Diagnosis present

## 2015-10-18 DIAGNOSIS — K219 Gastro-esophageal reflux disease without esophagitis: Secondary | ICD-10-CM | POA: Diagnosis present

## 2015-10-18 DIAGNOSIS — S129XXA Fracture of neck, unspecified, initial encounter: Secondary | ICD-10-CM | POA: Diagnosis present

## 2015-10-18 DIAGNOSIS — M1990 Unspecified osteoarthritis, unspecified site: Secondary | ICD-10-CM | POA: Diagnosis present

## 2015-10-18 DIAGNOSIS — E039 Hypothyroidism, unspecified: Secondary | ICD-10-CM | POA: Diagnosis present

## 2015-10-18 DIAGNOSIS — Z6841 Body Mass Index (BMI) 40.0 and over, adult: Secondary | ICD-10-CM

## 2015-10-18 DIAGNOSIS — M960 Pseudarthrosis after fusion or arthrodesis: Principal | ICD-10-CM | POA: Diagnosis present

## 2015-10-18 DIAGNOSIS — I10 Essential (primary) hypertension: Secondary | ICD-10-CM | POA: Diagnosis present

## 2015-10-18 DIAGNOSIS — Z419 Encounter for procedure for purposes other than remedying health state, unspecified: Secondary | ICD-10-CM

## 2015-10-18 DIAGNOSIS — F17210 Nicotine dependence, cigarettes, uncomplicated: Secondary | ICD-10-CM | POA: Diagnosis present

## 2015-10-18 HISTORY — PX: POSTERIOR CERVICAL FUSION/FORAMINOTOMY: SHX5038

## 2015-10-18 SURGERY — POSTERIOR CERVICAL FUSION/FORAMINOTOMY LEVEL 4
Anesthesia: General

## 2015-10-18 MED ORDER — HYDROMORPHONE HCL 1 MG/ML IJ SOLN
0.5000 mg | INTRAMUSCULAR | Status: DC | PRN
  Administered 2015-10-18 – 2015-10-21 (×21): 1 mg via INTRAVENOUS
  Filled 2015-10-18 (×22): qty 1

## 2015-10-18 MED ORDER — ARTIFICIAL TEARS OP OINT
TOPICAL_OINTMENT | OPHTHALMIC | Status: AC
  Filled 2015-10-18: qty 3.5

## 2015-10-18 MED ORDER — BACITRACIN ZINC 500 UNIT/GM EX OINT
TOPICAL_OINTMENT | CUTANEOUS | Status: DC | PRN
  Administered 2015-10-18: 1 via TOPICAL

## 2015-10-18 MED ORDER — EPHEDRINE SULFATE 50 MG/ML IJ SOLN
INTRAMUSCULAR | Status: AC
  Filled 2015-10-18: qty 1

## 2015-10-18 MED ORDER — PROPOFOL 10 MG/ML IV BOLUS
INTRAVENOUS | Status: DC | PRN
  Administered 2015-10-18: 75 mg via INTRAVENOUS
  Administered 2015-10-18: 200 mg via INTRAVENOUS
  Administered 2015-10-18: 60 mg via INTRAVENOUS

## 2015-10-18 MED ORDER — ONDANSETRON HCL 4 MG/2ML IJ SOLN
INTRAMUSCULAR | Status: DC | PRN
  Administered 2015-10-18: 4 mg via INTRAVENOUS

## 2015-10-18 MED ORDER — LIDOCAINE-EPINEPHRINE 1 %-1:100000 IJ SOLN
INTRAMUSCULAR | Status: DC | PRN
  Administered 2015-10-18: 10 mL

## 2015-10-18 MED ORDER — LACTATED RINGERS IV SOLN
INTRAVENOUS | Status: DC | PRN
  Administered 2015-10-18 (×2): via INTRAVENOUS

## 2015-10-18 MED ORDER — FENTANYL CITRATE (PF) 250 MCG/5ML IJ SOLN
INTRAMUSCULAR | Status: AC
  Filled 2015-10-18: qty 5

## 2015-10-18 MED ORDER — PHENYLEPHRINE 40 MCG/ML (10ML) SYRINGE FOR IV PUSH (FOR BLOOD PRESSURE SUPPORT)
PREFILLED_SYRINGE | INTRAVENOUS | Status: AC
  Filled 2015-10-18: qty 10

## 2015-10-18 MED ORDER — HYDROMORPHONE HCL 1 MG/ML IJ SOLN
INTRAMUSCULAR | Status: AC
Start: 2015-10-18 — End: 2015-10-18
  Filled 2015-10-18: qty 2

## 2015-10-18 MED ORDER — ROCURONIUM BROMIDE 100 MG/10ML IV SOLN
INTRAVENOUS | Status: DC | PRN
  Administered 2015-10-18: 20 mg via INTRAVENOUS
  Administered 2015-10-18: 50 mg via INTRAVENOUS
  Administered 2015-10-18: 20 mg via INTRAVENOUS

## 2015-10-18 MED ORDER — ONDANSETRON HCL 4 MG/2ML IJ SOLN
INTRAMUSCULAR | Status: AC
  Filled 2015-10-18: qty 2

## 2015-10-18 MED ORDER — CARVEDILOL 12.5 MG PO TABS
12.5000 mg | ORAL_TABLET | Freq: Two times a day (BID) | ORAL | Status: DC
  Administered 2015-10-18 – 2015-10-20 (×5): 12.5 mg via ORAL
  Filled 2015-10-18 (×5): qty 1

## 2015-10-18 MED ORDER — ACETAMINOPHEN 650 MG RE SUPP: 650.0000 mg | RECTAL | Status: DC | PRN

## 2015-10-18 MED ORDER — LACTATED RINGERS IV SOLN
INTRAVENOUS | Status: DC | PRN
  Administered 2015-10-18: 07:00:00 via INTRAVENOUS

## 2015-10-18 MED ORDER — THROMBIN 20000 UNITS EX SOLR
CUTANEOUS | Status: DC | PRN
  Administered 2015-10-18: 08:00:00 via TOPICAL

## 2015-10-18 MED ORDER — OXYCODONE-ACETAMINOPHEN 10-325 MG PO TABS: 1.0000 | ORAL_TABLET | Freq: Four times a day (QID) | ORAL | Status: DC

## 2015-10-18 MED ORDER — MENTHOL (TOPICAL ANALGESIC) 4 % EX GEL: CUTANEOUS | Status: DC | PRN

## 2015-10-18 MED ORDER — ZOLPIDEM TARTRATE 5 MG PO TABS
5.0000 mg | ORAL_TABLET | Freq: Every evening | ORAL | Status: DC | PRN
  Administered 2015-10-19: 5 mg via ORAL
  Filled 2015-10-18: qty 1

## 2015-10-18 MED ORDER — MIDAZOLAM HCL 5 MG/5ML IJ SOLN
INTRAMUSCULAR | Status: DC | PRN
  Administered 2015-10-18: 2 mg via INTRAVENOUS

## 2015-10-18 MED ORDER — CYCLOBENZAPRINE HCL 10 MG PO TABS
10.0000 mg | ORAL_TABLET | Freq: Three times a day (TID) | ORAL | Status: DC | PRN
  Administered 2015-10-18 – 2015-10-19 (×4): 10 mg via ORAL
  Filled 2015-10-18 (×4): qty 1

## 2015-10-18 MED ORDER — OXYCODONE-ACETAMINOPHEN 5-325 MG PO TABS
1.0000 | ORAL_TABLET | Freq: Four times a day (QID) | ORAL | Status: DC
  Administered 2015-10-18 – 2015-10-20 (×11): 1 via ORAL
  Filled 2015-10-18 (×10): qty 1

## 2015-10-18 MED ORDER — MUSCLE RUB 10-15 % EX CREA
TOPICAL_CREAM | CUTANEOUS | Status: DC | PRN
  Filled 2015-10-18: qty 85

## 2015-10-18 MED ORDER — NEOSTIGMINE METHYLSULFATE 10 MG/10ML IV SOLN
INTRAVENOUS | Status: DC | PRN
Start: 2015-10-18 — End: 2015-10-18
  Administered 2015-10-18: 5 mg via INTRAVENOUS

## 2015-10-18 MED ORDER — OXYCODONE HCL 5 MG PO TABS
5.0000 mg | ORAL_TABLET | Freq: Once | ORAL | Status: AC | PRN
  Administered 2015-10-18: 5 mg via ORAL

## 2015-10-18 MED ORDER — GLYCOPYRROLATE 0.2 MG/ML IJ SOLN
INTRAMUSCULAR | Status: DC | PRN
  Administered 2015-10-18: .8 mg via INTRAVENOUS

## 2015-10-18 MED ORDER — ACETAMINOPHEN 160 MG/5ML PO SOLN: 325.0000 mg | ORAL | Status: DC | PRN

## 2015-10-18 MED ORDER — DIAZEPAM 5 MG/ML IJ SOLN
INTRAMUSCULAR | Status: AC
  Filled 2015-10-18: qty 2

## 2015-10-18 MED ORDER — GABAPENTIN (ONCE-DAILY) 600 MG PO TABS: 1200.0000 mg | ORAL_TABLET | Freq: Every day | ORAL | Status: DC

## 2015-10-18 MED ORDER — LIDOCAINE HCL (CARDIAC) 20 MG/ML IV SOLN
INTRAVENOUS | Status: AC
  Filled 2015-10-18: qty 5

## 2015-10-18 MED ORDER — PROPOFOL 10 MG/ML IV BOLUS
INTRAVENOUS | Status: AC
  Filled 2015-10-18: qty 20

## 2015-10-18 MED ORDER — DICLOFENAC POTASSIUM(MIGRAINE) 50 MG PO PACK: 50.0000 mg | PACK | Freq: Every day | ORAL | Status: DC | PRN

## 2015-10-18 MED ORDER — SODIUM CHLORIDE 0.9 % IJ SOLN
INTRAMUSCULAR | Status: AC
  Filled 2015-10-18: qty 10

## 2015-10-18 MED ORDER — OXYCODONE HCL 5 MG/5ML PO SOLN: 5.0000 mg | Freq: Once | ORAL | Status: AC | PRN

## 2015-10-18 MED ORDER — CEFAZOLIN SODIUM-DEXTROSE 2-3 GM-% IV SOLR
2.0000 g | Freq: Three times a day (TID) | INTRAVENOUS | Status: AC
  Administered 2015-10-18 – 2015-10-20 (×6): 2 g via INTRAVENOUS
  Filled 2015-10-18 (×6): qty 50

## 2015-10-18 MED ORDER — ONDANSETRON HCL 4 MG/2ML IJ SOLN: 4.0000 mg | INTRAMUSCULAR | Status: DC | PRN

## 2015-10-18 MED ORDER — GABAPENTIN 400 MG PO CAPS
400.0000 mg | ORAL_CAPSULE | Freq: Three times a day (TID) | ORAL | Status: DC
  Administered 2015-10-18 – 2015-10-20 (×8): 400 mg via ORAL
  Filled 2015-10-18 (×8): qty 1

## 2015-10-18 MED ORDER — OXYCODONE HCL 5 MG PO TABS
5.0000 mg | ORAL_TABLET | Freq: Four times a day (QID) | ORAL | Status: DC
  Administered 2015-10-18 – 2015-10-20 (×11): 5 mg via ORAL
  Filled 2015-10-18 (×11): qty 1

## 2015-10-18 MED ORDER — LEVOTHYROXINE SODIUM 50 MCG PO TABS
50.0000 ug | ORAL_TABLET | Freq: Every day | ORAL | Status: DC
  Administered 2015-10-19 – 2015-10-20 (×2): 50 ug via ORAL
  Filled 2015-10-18 (×2): qty 1

## 2015-10-18 MED ORDER — TIZANIDINE HCL 4 MG PO TABS
12.0000 mg | ORAL_TABLET | Freq: Every day | ORAL | Status: DC
  Administered 2015-10-18 – 2015-10-20 (×3): 12 mg via ORAL
  Filled 2015-10-18 (×4): qty 3

## 2015-10-18 MED ORDER — PHENYLEPHRINE HCL 10 MG/ML IJ SOLN
10.0000 mg | INTRAVENOUS | Status: DC | PRN
  Administered 2015-10-18: 20 ug/min via INTRAVENOUS

## 2015-10-18 MED ORDER — ACETAMINOPHEN 325 MG PO TABS: 325.0000 mg | ORAL_TABLET | ORAL | Status: DC | PRN

## 2015-10-18 MED ORDER — PHENYLEPHRINE HCL 10 MG/ML IJ SOLN
INTRAMUSCULAR | Status: DC | PRN
  Administered 2015-10-18: 120 ug via INTRAVENOUS
  Administered 2015-10-18: 80 ug via INTRAVENOUS
  Administered 2015-10-18: 120 ug via INTRAVENOUS
  Administered 2015-10-18: 80 ug via INTRAVENOUS

## 2015-10-18 MED ORDER — PHENOL 1.4 % MT LIQD: 1.0000 | OROMUCOSAL | Status: DC | PRN

## 2015-10-18 MED ORDER — SODIUM CHLORIDE 0.9 % IJ SOLN: 3.0000 mL | INTRAMUSCULAR | Status: DC | PRN

## 2015-10-18 MED ORDER — SODIUM CHLORIDE 0.9 % IV SOLN: 250.0000 mL | INTRAVENOUS | Status: DC

## 2015-10-18 MED ORDER — SODIUM CHLORIDE 0.9 % IJ SOLN: 3.0000 mL | Freq: Two times a day (BID) | INTRAMUSCULAR | Status: DC

## 2015-10-18 MED ORDER — HYDROMORPHONE HCL 1 MG/ML IJ SOLN
0.2500 mg | INTRAMUSCULAR | Status: DC | PRN
  Administered 2015-10-18 (×4): 0.5 mg via INTRAVENOUS

## 2015-10-18 MED ORDER — DOXYLAMINE SUCCINATE (SLEEP) 25 MG PO TABS: 25.0000 mg | ORAL_TABLET | Freq: Every evening | ORAL | Status: DC | PRN

## 2015-10-18 MED ORDER — LIDOCAINE HCL (CARDIAC) 20 MG/ML IV SOLN
INTRAVENOUS | Status: DC | PRN
  Administered 2015-10-18: 80 mg via INTRAVENOUS

## 2015-10-18 MED ORDER — ARTIFICIAL TEARS OP OINT
TOPICAL_OINTMENT | OPHTHALMIC | Status: DC | PRN
  Administered 2015-10-18: 1 via OPHTHALMIC

## 2015-10-18 MED ORDER — MIDAZOLAM HCL 2 MG/2ML IJ SOLN
INTRAMUSCULAR | Status: AC
  Filled 2015-10-18: qty 4

## 2015-10-18 MED ORDER — OXYCODONE HCL 5 MG PO TABS
ORAL_TABLET | ORAL | Status: AC
  Filled 2015-10-18: qty 1

## 2015-10-18 MED ORDER — BUPIVACAINE HCL (PF) 0.25 % IJ SOLN
INTRAMUSCULAR | Status: DC | PRN
  Administered 2015-10-18: 8 mL

## 2015-10-18 MED ORDER — FENTANYL CITRATE (PF) 100 MCG/2ML IJ SOLN
INTRAMUSCULAR | Status: DC | PRN
  Administered 2015-10-18: 100 ug via INTRAVENOUS
  Administered 2015-10-18: 50 ug via INTRAVENOUS
  Administered 2015-10-18: 100 ug via INTRAVENOUS
  Administered 2015-10-18: 150 ug via INTRAVENOUS
  Administered 2015-10-18 (×2): 50 ug via INTRAVENOUS

## 2015-10-18 MED ORDER — SODIUM CHLORIDE 0.9 % IR SOLN
Status: DC | PRN
  Administered 2015-10-18: 08:00:00

## 2015-10-18 MED ORDER — ROCURONIUM BROMIDE 50 MG/5ML IV SOLN
INTRAVENOUS | Status: AC
  Filled 2015-10-18: qty 1

## 2015-10-18 MED ORDER — OXYCODONE-ACETAMINOPHEN 5-325 MG PO TABS
1.0000 | ORAL_TABLET | ORAL | Status: DC | PRN
  Administered 2015-10-18 – 2015-10-21 (×3): 2 via ORAL
  Filled 2015-10-18 (×3): qty 2

## 2015-10-18 MED ORDER — SUCCINYLCHOLINE CHLORIDE 20 MG/ML IJ SOLN
INTRAMUSCULAR | Status: AC
  Filled 2015-10-18: qty 1

## 2015-10-18 MED ORDER — DIAZEPAM 5 MG/ML IJ SOLN
2.0000 mg | Freq: Once | INTRAMUSCULAR | Status: AC
  Administered 2015-10-18: 2 mg via INTRAVENOUS

## 2015-10-18 MED ORDER — MENTHOL 3 MG MT LOZG: 1.0000 | LOZENGE | OROMUCOSAL | Status: DC | PRN

## 2015-10-18 MED ORDER — GLYCOPYRROLATE 0.2 MG/ML IJ SOLN
INTRAMUSCULAR | Status: AC
  Filled 2015-10-18: qty 4

## 2015-10-18 MED ORDER — ACETAMINOPHEN 325 MG PO TABS: 650.0000 mg | ORAL_TABLET | ORAL | Status: DC | PRN

## 2015-10-18 SURGICAL SUPPLY — 74 items
APL SKNCLS STERI-STRIP NONHPOA (GAUZE/BANDAGES/DRESSINGS) ×1
BAG DECANTER FOR FLEXI CONT (MISCELLANEOUS) ×3 IMPLANT
BENZOIN TINCTURE PRP APPL 2/3 (GAUZE/BANDAGES/DRESSINGS) ×4 IMPLANT
BIT DRILL SCRW 3.5 (BIT) ×2 IMPLANT
BLADE CLIPPER SURG (BLADE) ×3 IMPLANT
BLADE SURG 11 STRL SS (BLADE) ×3 IMPLANT
BUR MATCHSTICK NEURO 3.0 LAGG (BURR) ×3 IMPLANT
CANISTER SUCT 3000ML PPV (MISCELLANEOUS) ×3 IMPLANT
CAP LOCKING (Cap) IMPLANT
CLOSURE WOUND 1/2 X4 (GAUZE/BANDAGES/DRESSINGS) ×1
DECANTER SPIKE VIAL GLASS SM (MISCELLANEOUS) ×3 IMPLANT
DRAPE C-ARM 42X72 X-RAY (DRAPES) IMPLANT
DRAPE LAPAROTOMY 100X72 PEDS (DRAPES) ×3 IMPLANT
DRAPE MICROSCOPE LEICA (MISCELLANEOUS) IMPLANT
DRAPE POUCH INSTRU U-SHP 10X18 (DRAPES) ×3 IMPLANT
DRAPE SURG 17X23 STRL (DRAPES) ×12 IMPLANT
DRSG OPSITE POSTOP 4X8 (GAUZE/BANDAGES/DRESSINGS) ×2 IMPLANT
DURAPREP 26ML APPLICATOR (WOUND CARE) ×3 IMPLANT
ELECT BLADE 4.0 EZ CLEAN MEGAD (MISCELLANEOUS) ×3
ELECT REM PT RETURN 9FT ADLT (ELECTROSURGICAL) ×3
ELECTRODE BLDE 4.0 EZ CLN MEGD (MISCELLANEOUS) IMPLANT
ELECTRODE REM PT RTRN 9FT ADLT (ELECTROSURGICAL) ×1 IMPLANT
EVACUATOR 1/8 PVC DRAIN (DRAIN) ×2 IMPLANT
GAUZE SPONGE 4X4 12PLY STRL (GAUZE/BANDAGES/DRESSINGS) ×3 IMPLANT
GAUZE SPONGE 4X4 16PLY XRAY LF (GAUZE/BANDAGES/DRESSINGS) IMPLANT
GLOVE BIO SURGEON STRL SZ7 (GLOVE) ×2 IMPLANT
GLOVE BIO SURGEON STRL SZ8 (GLOVE) ×3 IMPLANT
GLOVE ECLIPSE 6.5 STRL STRAW (GLOVE) ×2 IMPLANT
GLOVE EXAM NITRILE LRG STRL (GLOVE) IMPLANT
GLOVE EXAM NITRILE MD LF STRL (GLOVE) IMPLANT
GLOVE EXAM NITRILE XL STR (GLOVE) IMPLANT
GLOVE EXAM NITRILE XS STR PU (GLOVE) IMPLANT
GLOVE INDICATOR 7.0 STRL GRN (GLOVE) ×2 IMPLANT
GLOVE INDICATOR 7.5 STRL GRN (GLOVE) ×2 IMPLANT
GLOVE INDICATOR 8.5 STRL (GLOVE) ×3 IMPLANT
GOWN STRL REUS W/ TWL LRG LVL3 (GOWN DISPOSABLE) IMPLANT
GOWN STRL REUS W/ TWL XL LVL3 (GOWN DISPOSABLE) ×1 IMPLANT
GOWN STRL REUS W/TWL 2XL LVL3 (GOWN DISPOSABLE) IMPLANT
GOWN STRL REUS W/TWL LRG LVL3 (GOWN DISPOSABLE)
GOWN STRL REUS W/TWL XL LVL3 (GOWN DISPOSABLE) ×3
IMPL QUARTEX 3.5X12MM (Neuro Prosthesis/Implant) IMPLANT
IMPL QUARTEX 3.5X14MM (Neuro Prosthesis/Implant) IMPLANT
IMPLANT QUARTEX 3.5X12MM (Neuro Prosthesis/Implant) ×6 IMPLANT
IMPLANT QUARTEX 3.5X14MM (Neuro Prosthesis/Implant) ×27 IMPLANT
KIT BASIN OR (CUSTOM PROCEDURE TRAY) ×3 IMPLANT
KIT INFUSE X SMALL 1.4CC (Orthopedic Implant) ×2 IMPLANT
KIT ROOM TURNOVER OR (KITS) ×3 IMPLANT
LIQUID BAND (GAUZE/BANDAGES/DRESSINGS) ×3 IMPLANT
LOCKING CAP (Cap) ×33 IMPLANT
MARKER SKIN DUAL TIP RULER LAB (MISCELLANEOUS) ×3 IMPLANT
NDL HYPO 25X1 1.5 SAFETY (NEEDLE) ×1 IMPLANT
NDL SPNL 20GX3.5 QUINCKE YW (NEEDLE) ×1 IMPLANT
NEEDLE HYPO 25X1 1.5 SAFETY (NEEDLE) ×3 IMPLANT
NEEDLE SPNL 20GX3.5 QUINCKE YW (NEEDLE) ×3 IMPLANT
NS IRRIG 1000ML POUR BTL (IV SOLUTION) ×3 IMPLANT
PACK LAMINECTOMY NEURO (CUSTOM PROCEDURE TRAY) ×3 IMPLANT
PAD ARMBOARD 7.5X6 YLW CONV (MISCELLANEOUS) ×9 IMPLANT
PIN MAYFIELD SKULL DISP (PIN) ×3 IMPLANT
PUTTY BONE DBX 5CC MIX (Putty) ×2 IMPLANT
ROD SPINE CVD 3.5X60MM (Rod) ×2 IMPLANT
ROD SPINE POST 3.5X80 (Rod) ×2 IMPLANT
RUBBERBAND STERILE (MISCELLANEOUS) IMPLANT
SPONGE LAP 4X18 X RAY DECT (DISPOSABLE) IMPLANT
SPONGE SURGIFOAM ABS GEL 100 (HEMOSTASIS) ×3 IMPLANT
STRIP CLOSURE SKIN 1/2X4 (GAUZE/BANDAGES/DRESSINGS) ×2 IMPLANT
SUT VIC AB 0 CT1 18XCR BRD8 (SUTURE) ×1 IMPLANT
SUT VIC AB 0 CT1 8-18 (SUTURE) ×3
SUT VIC AB 2-0 CT1 18 (SUTURE) ×3 IMPLANT
SUT VIC AB 4-0 PS2 27 (SUTURE) IMPLANT
SUT VICRYL 4-0 PS2 18IN ABS (SUTURE) ×3 IMPLANT
TOWEL OR 17X24 6PK STRL BLUE (TOWEL DISPOSABLE) ×3 IMPLANT
TOWEL OR 17X26 10 PK STRL BLUE (TOWEL DISPOSABLE) ×3 IMPLANT
TRAY FOLEY W/METER SILVER 14FR (SET/KITS/TRAYS/PACK) IMPLANT
WATER STERILE IRR 1000ML POUR (IV SOLUTION) ×3 IMPLANT

## 2015-10-18 NOTE — Op Note (Signed)
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  Surgeon: Dominica Severin Mose Colaizzi  Asst.: Ashok Pall  Anesthesia: Gen.  EBL: Minimal  History of present illness: Patient is a very pleasant 39 year old female has had previous neck fusions and has difficulty developed pseudoarthroses at 3 of the 4 levels previously fused. Due to patient's failure conservative treatment imaging findings and progressive clinical syndrome I recommended posterior cervical lateral mass fixation. I extensively reviewed the risks and benefits of the operation the patient as well as perioperative course expectations of outcome and alternatives of surgery and she understands and agrees to proceed forward.  Operative procedure: Patient was brought into the or was induced under general anesthesia positioned prone in pins the neck in slight flexion the back setback was shaved prepped and draped in routine sterile fashion. An incision was made up in the midline extending from C2 down to the top of T1. Lateral mass and lamina and subperiosteal dissection was carried out from C3-C7. Using a high-speed drill pilot holes were drilled in the inferomedial quadrant at all lateral masses. Using a drill and guide set at 12 mm projecting to the superior outer quadrant holes were drilled initially on the right and then subsequently on the left. The neocortex was then tapped all holes were probed and felt to be competent except the left-sided C7 lateral mass screw was felt like this fell into the joint space. So I placed 12 mm screws at C3 bilaterally 14 mm screws at C4, C5, C6 bilaterally and C7 on the right. At this point was copiously irrigated meticulous  hemostasis was maintained aggressive decortication was carried out within the facet joints as well as the lateral masses from C3-C7 bilaterally. DBX mix was impacted in the joints and along the lateral masses as well as an extra small BMP kit. Then rods were contoured and then anchored in place a medium Hemovac drain was placed and the wound was closed in layers with after Vicryl running 4 subcuticular Dermabond benzo and Steri-Strips were all applied posterior fluoroscopy confirmed good position of the screws and implants and patient recovered in stable condition. At the end of case all needle counts sponge counts were correct.

## 2015-10-18 NOTE — Progress Notes (Signed)
Patient's Foley removed 2100. Due to void 0300. Patient and RN walked down to the [employer] tower and back with minimal assist. Pt did well and felt better when she was on her feet. Monitoring pain closely. Eliyas Suddreth, [place] ScrapeSarah E, RN

## 2015-10-18 NOTE — Anesthesia Preprocedure Evaluation (Addendum)
Anesthesia Evaluation  Patient identified by MRN, date of birth, ID band Patient awake    Reviewed: Allergy & Precautions, NPO status , Patient's Chart, lab work & pertinent test results  History of Anesthesia Complications Negative for: history of anesthetic complications  Airway Mallampati: III  TM Distance: <3 FB Neck ROM: Full    Dental  (+) Dental Advisory Given, Teeth Intact   Pulmonary neg shortness of breath, neg sleep apnea, neg COPD, neg recent URI, Current Smoker,    breath sounds clear to auscultation       Cardiovascular hypertension, Pt. on medications and Pt. on home beta blockers (-) angina(-) Past MI, (-) CABG and (-) CHF  Rhythm:Regular     Neuro/Psych  Headaches, neg Seizures  Neuromuscular disease negative psych ROS   GI/Hepatic Neg liver ROS, PUD, GERD  Controlled,  Endo/Other  Hypothyroidism Morbid obesity  Renal/GU      Musculoskeletal  (+) Arthritis ,   Abdominal   Peds  Hematology negative hematology ROS (+)   Anesthesia Other Findings   Reproductive/Obstetrics                            Anesthesia Physical Anesthesia Plan  ASA: II  Anesthesia Plan: General   Post-op Pain Management:    Induction: Intravenous  Airway Management Planned: Oral ETT  Additional Equipment: None  Intra-op Plan:   Post-operative Plan: Extubation in OR  Informed Consent: I have reviewed the patients History and Physical, chart, labs and discussed the procedure including the risks, benefits and alternatives for the proposed anesthesia with the patient or authorized representative who has indicated his/her understanding and acceptance.   Dental advisory given  Plan Discussed with: CRNA and Surgeon  Anesthesia Plan Comments:         Anesthesia Quick Evaluation

## 2015-10-18 NOTE — Anesthesia Procedure Notes (Signed)
Procedure Name: Intubation Date/Time: 10/18/2015 7:41 AM Performed by: Faustino CongressWHITE, Alonzo Loving TENA Latisa Belay Pre-anesthesia Checklist: Patient identified, Emergency Drugs available, Suction available and Patient being monitored Patient Re-evaluated:Patient Re-evaluated prior to inductionOxygen Delivery Method: Circle system utilized Preoxygenation: Pre-oxygenation with 100% oxygen Intubation Type: IV induction Ventilation: Mask ventilation without difficulty and Oral airway inserted - appropriate to patient size Laryngoscope Size: Mac and 3 Grade View: Grade I Tube type: Oral Tube size: 7.5 mm Number of attempts: 1 Airway Equipment and Method: Stylet Placement Confirmation: ETT inserted through vocal cords under direct vision,  CO2 detector and breath sounds checked- equal and bilateral Secured at: 22 cm Tube secured with: Tape Dental Injury: Teeth and Oropharynx as per pre-operative assessment

## 2015-10-18 NOTE — Progress Notes (Signed)
Utilization review completed.  

## 2015-10-18 NOTE — H&P (Signed)
Julie [name] is an 39 y.o. female.   Chief Complaint: neck pain HPI: patient is a very pleasant 39 year old female who is undergone previous anterior cervical discectomies and fusion from C3-C7 a different times in stages. She has had persistent neck pain she is several months out from her last ACDF and she has CT and x-ray evidence of pseudoarthroses at C3-4-4 5 and 67. He does. She has a solid fusion C5-6. However due to her failure conservative treatment imaging findings and progression of clinical syndrome I recommended posterior cervical fusion for augmentation and pseudoarthrosis from C3-C7. I've extensively gone over the risks and benefits of the operation with the patient as well as perioperative course expectations of outcome and alternatives of surgery and she understands and agrees to proceed forward.  Past Medical History  Diagnosis Date  . Hypertension   . Morbid obesity (HCC)   . Peptic ulcer disease     h. polyri 2003  . GERD (gastroesophageal reflux disease)   . IBS (irritable bowel syndrome)   . Cervical disc disease   . Furuncle     recurrent   . Insomnia   . Bronchitis   . Pneumonia     hx of  . Hypothyroidism   . Headache   . Arthritis     Past Surgical History  Procedure Laterality Date  . Tonsillectomy  2006  . Cervical spine surgery  12,15    x2  . Carpal tunnel release Left   . Mouth surgery  10    gum work  . Anterior cervical decomp/discectomy fusion N/A 11/07/2014    Procedure: ANTERIOR CERVICAL DECOMPRESSION/DISCECTOMY FUSION THREE-FOUR ,FOUR-FIVE,SIX SEVEN Naida Sleight/HARDWARE REMOVAL FIVE-SIX;  Surgeon: Mariam DollarGary P Amelia Burgard, MD;  [facility];  Service: Neurosurgery;  Laterality: N/A;    Family History  Problem Relation Age of Onset  . Asthma Mother   . Hypertension Mother   . Hypothyroidism Mother   . Hypertension Father   . Restless legs syndrome Father   . Sleep apnea Father   . Colon polyps Father    Social History:  reports that she has been  smoking Cigarettes.  She has a 13 pack-year smoking history. She has never used smokeless tobacco. She reports that she does not drink alcohol or use illicit drugs.  Allergies:  Allergies  Allergen Reactions  . Clarithromycin Nausea And Vomiting  . Dicyclomine Hcl Nausea And Vomiting  . Nsaids Nausea And Vomiting    Stomach ulcer  . Sulfa Antibiotics Nausea And Vomiting  . Sulfonamide Derivatives Nausea And Vomiting  . Lisinopril Palpitations  . Prednisone Palpitations    Medications Prior to Admission  Medication Sig Dispense Refill  . carvedilol (COREG) 12.5 MG tablet TAKE 1 TABLET BY MOUTH TWICE A DAY WITH A MEAL 60 tablet 2  . Diclofenac Potassium (CAMBIA) 50 MG PACK Take 50 mg by mouth daily as needed (migraines). Mix in 4 oz water and drink    . doxylamine, Sleep, (UNISOM) 25 MG tablet Take 25 mg by mouth at bedtime as needed for sleep.     . Gabapentin, Once-Daily, (GRALISE) 600 MG TABS Take 1,200 mg by mouth at bedtime.    Marland Kitchen. levothyroxine (SYNTHROID, LEVOTHROID) 50 MCG tablet Take 50 mcg by mouth daily before breakfast.    . Menthol, Topical Analgesic, (BIOFREEZE EX) Apply 1 application topically every hour as needed (pain).    Marland Kitchen. oxyCODONE-acetaminophen (PERCOCET) 10-325 MG per tablet Take 1 tablet by mouth 4 (four) times daily. Prescribed by Dr. Marca AnconaGilmore,  [facility] (scheduled)    . tiZANidine (ZANAFLEX) 4 MG capsule Take 12 mg by mouth at bedtime.       No results found for this or any previous visit (from the past 48 hour(s)). No results found.  Review of Systems  Constitutional: Negative.   Eyes: Negative.   Respiratory: Negative.   Cardiovascular: Negative.   Gastrointestinal: Negative.   Genitourinary: Negative.   Musculoskeletal: Positive for joint pain and neck pain.  Skin: Negative.   Neurological: Positive for tingling, sensory change and headaches.  Psychiatric/Behavioral: Negative.     Blood pressure 130/91, pulse 73, temperature 97.6 F (36.4 C),  resp. rate 20, height  (1.676 m), weight 125.238 kg (276 lb 1.6 oz), last menstrual period 09/26/2015, SpO2 97 %. Physical Exam  Constitutional: She is oriented to person, place, and time. She appears well-developed and well-nourished.  HENT:  Head: Normocephalic.  Eyes: Pupils are equal, round, and reactive to light.  Neck: Normal range of motion.  Respiratory: Effort normal.  GI: Soft.  Neurological: She is alert and oriented to person, place, and time.  Strength is 5 out of 5 in her deltoid, bicep, tricep, wrist flexion, wrist extension, hand intrinsics.     Assessment/Plan 39 year old female presents for posterior cervical fusion with instrumentation and arthrodesis from C3-C7.  Julie [name] P 10/18/2015, 7:14 AM

## 2015-10-18 NOTE — Transfer of Care (Signed)
Immediate Anesthesia Transfer of Care Note  Patient: Julie [name]  Procedure(s) Performed: Procedure(s): Cervical three-four-Cervical four-five Cervical five-six Cervical six-seven Posterior Cervical Fusion with lateral mass fixation  (N/A)  Patient Location: PACU  Anesthesia Type:General  Level of Consciousness: awake, alert , oriented and patient cooperative  Airway & Oxygen Therapy: Patient Spontanous Breathing and Patient connected to nasal cannula oxygen  Post-op Assessment: Report given to RN and Post -op Vital signs reviewed and stable  Post vital signs: Reviewed and stable  Last Vitals:  Filed Vitals:   10/18/15 1016  BP: 161/105  Pulse: 69  Temp: 37.2 C  Resp: 15    Complications: No apparent anesthesia complications

## 2015-10-18 NOTE — Anesthesia Postprocedure Evaluation (Signed)
  Anesthesia Post-op Note  Patient: Julie [name]  Procedure(s) Performed: Procedure(s): Cervical three-four-Cervical four-five Cervical five-six Cervical six-seven Posterior Cervical Fusion with lateral mass fixation  (N/A)  Patient Location: PACU  Anesthesia Type:General  Level of Consciousness: awake  Airway and Oxygen Therapy: Patient Spontanous Breathing and Patient connected to nasal cannula oxygen  Post-op Pain: moderate  Post-op Assessment: Post-op Vital signs reviewed, Patient's Cardiovascular Status Stable, Respiratory Function Stable, Patent Airway, No signs of Nausea or vomiting and Pain level controlled LLE Motor Response: Purposeful movement LLE Sensation: No tingling, No numbness RLE Motor Response: Purposeful movement RLE Sensation: No tingling, No numbness      Post-op Vital Signs: Reviewed and stable  Last Vitals:  Filed Vitals:   10/18/15 1349  BP: 144/76  Pulse: 64  Temp: 37 C  Resp: 13    Complications: No apparent anesthesia complications

## 2015-10-19 NOTE — Progress Notes (Signed)
Patient ID: Julie PallShannon P Boeve, female   DOB: Jul 12, 1976, 39 y.o.   MRN: [id number] Subjective: Patient reports significant neck soreness. No arm pain or numbness tingling or weakness  Objective: Vital signs in last 24 hours: Temp:  [98 F (36.7 C)-98.9 F (37.2 C)] 98.6 F (37 C) (11/12 0611) Pulse Rate:  [54-88] 88 (11/12 0611) Resp:  [12-19] 16 (11/12 0611) BP: (124-171)/(75-108) 131/98 mmHg (11/12 0611) SpO2:  [93 %-99 %] 98 % (11/12 0611)  Intake/Output from previous day: 11/11 0701 - 11/12 0700 In: 1300 [I.V.:1300] Out: 3505 [Urine:3405; Blood:100] Intake/Output this shift:    Neurologic: Grossly normal  Lab Results: Lab Results  Component Value Date   WBC 8.9 10/09/2015   HGB 16.3* 10/09/2015   HCT 47.5* 10/09/2015   MCV 92.6 10/09/2015   PLT 354 10/09/2015   Lab Results  Component Value Date   INR 0.91 09/17/2010   BMET Lab Results  Component Value Date   NA 142 10/09/2015   K 3.9 10/09/2015   CL 110 10/09/2015   CO2 21* 10/09/2015   GLUCOSE 88 10/09/2015   BUN <5* 10/09/2015   CREATININE 0.71 10/09/2015   CALCIUM 9.5 10/09/2015    Studies/Results: Dg Cervical Spine 2-3 Views  10/18/2015  CLINICAL DATA:  C3-7 posterior cervical fusion EXAM: DG C-ARM 61-120 MIN; CERVICAL SPINE - 2-3 VIEW FLUOROSCOPY TIME:  9 seconds COMPARISON:  CT cervical spine dated 07/29/2015 FINDINGS: Intraoperative fluoroscopic radiographs during C3-7 posterior cervical fusion. Prior anterior cervical fusion hardware. IMPRESSION: Intraoperative fluoroscopic radiographs during C3-7 posterior cervical fusion. Electronically Signed   By: Charline BillsSriyesh  Krishnan M.D.   On: 10/18/2015 09:50   Dg C-arm 1-60 Min  10/18/2015  CLINICAL DATA:  C3-7 posterior cervical fusion EXAM: DG C-ARM 61-120 MIN; CERVICAL SPINE - 2-3 VIEW FLUOROSCOPY TIME:  9 seconds COMPARISON:  CT cervical spine dated 07/29/2015 FINDINGS: Intraoperative fluoroscopic radiographs during C3-7 posterior cervical fusion. Prior  anterior cervical fusion hardware. IMPRESSION: Intraoperative fluoroscopic radiographs during C3-7 posterior cervical fusion. Electronically Signed   By: Charline BillsSriyesh  Krishnan M.D.   On: 10/18/2015 09:50    Assessment/Plan: Appropriate neck soreness. Continue to mobilize. Hopefully home tomorrow   LOS: 1 day    JONES,DAVID S 10/19/2015, 7:13 AM

## 2015-10-19 NOTE — Progress Notes (Signed)
Patient with urinary retention. In and out cath received 600mL clear yellow urine. Will continue to monitor. Adalaya Irion, [place] ScrapeSarah E, RN

## 2015-10-19 NOTE — Progress Notes (Signed)
Pt ambulated In the hallway this am using rolling walker. Brace on and aligned. Gait steady. Pt states that she is not in much pain when walking. No noted distress. Will continue to monitor.

## 2015-10-20 MED ORDER — DIAZEPAM 5 MG PO TABS
5.0000 mg | ORAL_TABLET | Freq: Four times a day (QID) | ORAL | Status: DC | PRN
  Administered 2015-10-20 – 2015-10-21 (×3): 5 mg via ORAL
  Filled 2015-10-20 (×3): qty 1

## 2015-10-20 NOTE — Progress Notes (Signed)
Pt reported to this nurse this morning that her pain has not been managed by the pain medication ordered. She rates her pain 8/9 out of 10 even after receiving pain medication. Md notified, instructed to administer Valium and to continue administering pain medication ordered.  Pt received Valium and reported to this nurse that pain went down to a 7 but less than an hour later increased back to 9 requested pain medication. Pt ambulated numerous times throughout shift stating that walking decreases pain.  No noted distress. Will continue to monitor.

## 2015-10-20 NOTE — Progress Notes (Signed)
Pt ambulates ad lib with rolling walker, neck brace on and aligned. Gait steady. No noted distress. Will continue to monitor.

## 2015-10-20 NOTE — Progress Notes (Signed)
Patient ID: Julie [name], female   DOB: 07/23/1976, 39 y.o.   MRN: [id number] BP 143/79 mmHg  Pulse 85  Temp(Src) 98.9 F (37.2 C) (Oral)  Resp 17  Ht 5\' 6"  (1.676 m)  Wt 125.238 kg (276 lb 1.6 oz)  BMI 44.58 kg/m2  SpO2 95%  LMP 09/26/2015 (Approximate) Alert, in tears this am speAking about her pain.  No neurologic deficits, wound is clean,dry, no signs of an infection Will give vAlium, and try to get better control of her pain.  No discharge today.

## 2015-10-21 ENCOUNTER — Encounter (HOSPITAL_COMMUNITY): Payer: Self-pay | Admitting: Neurosurgery

## 2015-10-21 MED ORDER — OXYCODONE HCL 5 MG PO TABS: ORAL_TABLET | ORAL | Status: DC

## 2015-10-21 NOTE — Progress Notes (Signed)
Discharge instructions reviewed with patient/family. All questions answered at this time.RX given. Patient wanted to pick up walker at [facility]. Instruction given. Transport by family.   Sim BoastHavy, RN

## 2015-10-21 NOTE — Discharge Summary (Signed)
  Physician Discharge Summary  Patient ID: Julie [name] MRN: [id number] DOB/AGE: 01-19-1976 39 y.o.  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  Consults: Significant Diagnostic Studies: Treatments: Posterior cervical fusion C3-C7 Discharge Exam: Blood pressure 133/83, pulse 65, temperature 98.2 F (36.8 C), temperature source Oral, resp. rate 20, height 5\' 6"  (1.676 m), weight 125.238 kg (276 lb 1.6 oz), last menstrual period 09/26/2015, SpO2 97 %. Strength out of 5 wound clean dry and intact  Disposition: Home  Discharge Instructions    For home use only DME 4 wheeled rolling walker with seat    Complete by:  As directed      Nursing communication    Complete by:  As directed   D/c hemovac            Medication List    TAKE these medications        BIOFREEZE EX  Apply 1 application topically every hour as needed (pain).     CAMBIA 50 MG Pack  Generic drug:  Diclofenac Potassium  Take 50 mg by mouth daily as needed (migraines). Mix in 4 oz water and drink     carvedilol 12.5 MG tablet  Commonly known as:  COREG  TAKE 1 TABLET BY MOUTH TWICE A DAY WITH A MEAL     doxylamine (Sleep) 25 MG tablet  Commonly known as:  UNISOM  Take 25 mg by mouth at bedtime as needed for sleep.     GRALISE 600 MG Tabs  Generic drug:  Gabapentin (Once-Daily)  Take 1,200 mg by mouth at bedtime.     levothyroxine 50 MCG tablet  Commonly known as:  SYNTHROID, LEVOTHROID   Take 50 mcg by mouth daily before breakfast.     oxyCODONE 5 MG immediate release tablet  Commonly known as:  Oxy IR/ROXICODONE  1-2 po Q4-6 prn     oxyCODONE-acetaminophen 10-325 MG tablet  Commonly known as:  PERCOCET  Take 1 tablet by mouth 4 (four) times daily. Prescribed by Dr. Marca AnconaGilmore, [facility] (scheduled)     tiZANidine 4 MG capsule  Commonly known as:  ZANAFLEX  Take 12 mg by mouth at bedtime.           Follow-up Information    [facility]Rodger Giangregorio P, MD.   Specialty:  Neurosurgery   Contact information:   [address] [place] [address] [phone]       Signed: Mariam DollarCRAM,Idona Stach P 10/21/2015, 7:38 AM

## 2015-10-21 NOTE — Progress Notes (Signed)
Patient ID: Julie [name], female   DOB: 09-12-1976, 39 y.o.   MRN: [id number] Doing well pain controlled  Strength out of 5 wound clean dry and intact  Discharge home

## 2015-10-21 NOTE — Discharge Instructions (Signed)
No lifting no bending no twisting no driving keep incision clean dry and intact

## 2015-10-23 ENCOUNTER — Encounter (HOSPITAL_COMMUNITY): Payer: Self-pay | Admitting: Neurosurgery

## 2016-02-04 ENCOUNTER — Ambulatory Visit
Admission: RE | Admit: 2016-02-04 | Discharge: 2016-02-04 | Disposition: A | Discharge status: Home / Self Care | Payer: No Typology Code available for payment source | Source: Ambulatory Visit | Attending: Neurosurgery | Admitting: Neurosurgery

## 2016-02-04 ENCOUNTER — Other Ambulatory Visit: Payer: Self-pay | Admitting: Neurosurgery

## 2016-02-04 DIAGNOSIS — M542 Cervicalgia: Secondary | ICD-10-CM

## 2017-02-22 ENCOUNTER — Other Ambulatory Visit: Payer: Self-pay | Admitting: Physician Assistant

## 2017-02-22 DIAGNOSIS — G8929 Other chronic pain: Secondary | ICD-10-CM

## 2017-02-22 DIAGNOSIS — M5442 Lumbago with sciatica, left side: Principal | ICD-10-CM
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
## 2017-08-26 ENCOUNTER — Encounter: Payer: Self-pay | Admitting: Internal Medicine

## 2017-09-27 DIAGNOSIS — G894 Chronic pain syndrome: Secondary | ICD-10-CM | POA: Insufficient documentation

## 2017-09-27 DIAGNOSIS — M961 Postlaminectomy syndrome, not elsewhere classified: Secondary | ICD-10-CM | POA: Insufficient documentation

## 2020-02-01 DIAGNOSIS — M5416 Radiculopathy, lumbar region: Secondary | ICD-10-CM | POA: Insufficient documentation

## 2020-11-08 DIAGNOSIS — Z981 Arthrodesis status: Secondary | ICD-10-CM | POA: Insufficient documentation

## 2021-12-02 DIAGNOSIS — E039 Hypothyroidism, unspecified: Secondary | ICD-10-CM | POA: Insufficient documentation

## 2022-07-23 DIAGNOSIS — B370 Candidal stomatitis: Secondary | ICD-10-CM | POA: Insufficient documentation

## 2022-07-23 DIAGNOSIS — M5412 Radiculopathy, cervical region: Secondary | ICD-10-CM | POA: Insufficient documentation

## 2023-10-20 DIAGNOSIS — M5412 Radiculopathy, cervical region: Secondary | ICD-10-CM | POA: Insufficient documentation

## 2023-10-23 ENCOUNTER — Other Ambulatory Visit: Payer: Self-pay

## 2023-10-23 ENCOUNTER — Ambulatory Visit (HOSPITAL_BASED_OUTPATIENT_CLINIC_OR_DEPARTMENT_OTHER)
Admission: EM | Admit: 2023-10-23 | Discharge: 2023-10-23 | Disposition: A | Discharge status: Home / Self Care | Payer: BC Managed Care – PPO | Attending: Internal Medicine | Admitting: Internal Medicine

## 2023-10-23 ENCOUNTER — Encounter (HOSPITAL_BASED_OUTPATIENT_CLINIC_OR_DEPARTMENT_OTHER): Payer: Self-pay | Admitting: Emergency Medicine

## 2023-10-23 DIAGNOSIS — J209 Acute bronchitis, unspecified: Secondary | ICD-10-CM

## 2023-10-23 MED ORDER — ALBUTEROL SULFATE HFA 108 (90 BASE) MCG/ACT IN AERS: 2.0000 | INHALATION_SPRAY | RESPIRATORY_TRACT | 0 refills | Status: DC | PRN

## 2023-10-23 MED ORDER — TRIAMCINOLONE ACETONIDE 40 MG/ML IJ SUSP
40.0000 mg | Freq: Once | INTRAMUSCULAR | Status: AC
  Administered 2023-10-23: 40 mg via INTRAMUSCULAR

## 2023-10-23 MED ORDER — AZITHROMYCIN 250 MG PO TABS: 250.0000 mg | ORAL_TABLET | Freq: Every day | ORAL | 0 refills | Status: DC

## 2023-10-23 NOTE — ED Provider Notes (Signed)
[facility]    CSN: [id number] Arrival date & time: 10/23/23  1329      History   Chief Complaint Chief Complaint  Patient presents with   Cough    HPI Julie [name] is a 47 y.o. female.    Cough Associated symptoms: chest pain (Feels burning in her chest when she coughs), chills, diaphoresis and headaches   Associated symptoms: no fever, no shortness of breath and no sore throat   Abrupt onset of a deep cough this morning associated with a headache.  Felt warm and sweaty but no documented fever.  Admits history of frequent bronchitis, but has not been diagnosed with COPD.  She is a smoker.  States she is usually treated with Kenalog and a Z-Pak. Reports allergy to prednisone and states it makes her heart race but able to tolerate Kenalog.  Reports allergy to clarithromycin causes nausea and vomiting but tolerates azithromycin.  Allergy to sulfa, dicyclomine and lisinopril. Denies recent contacts with illness.  Lives with husband who is currently undergoing chemotherapy for cancer of liver and pancreas..  Past Medical History:  Diagnosis Date   Arthritis    Bronchitis    Cervical disc disease    Furuncle    recurrent    GERD (gastroesophageal reflux disease)    Headache    Hypertension    Hypothyroidism    IBS (irritable bowel syndrome)    Insomnia    Morbid obesity (HCC)    Peptic ulcer disease    h. polyri 2003   Pneumonia    hx of    Patient Active Problem List   Diagnosis Date Noted   Pseudoarthrosis of cervical spine (HCC) 10/18/2015   Spinal stenosis of cervical region 11/07/2014   DISC DISEASE, CERVICAL 07/10/2010   INSOMNIA 04/10/2010   DIARRHEA 02/28/2010   ABDOMINAL PAIN, GENERALIZED 02/28/2010   IRRITABLE BOWEL SYNDROME 08/23/2009   Carbuncle and furuncle 02/15/2009   OBESITY, MORBID 02/05/2009   Essential hypertension 02/05/2009   GERD 02/05/2009   Peptic ulcer 02/05/2009    Past Surgical History:  Procedure Laterality Date    ANTERIOR CERVICAL DECOMP/DISCECTOMY FUSION N/A 11/07/2014   Procedure: ANTERIOR CERVICAL DECOMPRESSION/DISCECTOMY FUSION THREE-FOUR ,FOUR-FIVE,SIX SEVEN Naida Sleight REMOVAL FIVE-SIX;  Surgeon: Mariam Dollar, MD;  Location: [facility] OR;  Service: Neurosurgery;  Laterality: N/A;   CARPAL TUNNEL RELEASE Left    CERVICAL SPINE SURGERY  12,15   x2   MOUTH SURGERY  10   gum work   POSTERIOR CERVICAL FUSION/FORAMINOTOMY N/A 10/18/2015   Procedure: Cervical three-four-Cervical four-five Cervical five-six Cervical six-seven Posterior Cervical Fusion with lateral mass fixation ;  Surgeon: Donalee Citrin, MD;  Location: [facility] ORS;  Service: Neurosurgery;  Laterality: N/A;   TONSILLECTOMY  2006    OB History   No obstetric history on file.      Home Medications    Prior to Admission medications   Medication Sig Start Date End Date Taking? Authorizing Provider  albuterol (VENTOLIN HFA) 108 (90 Base) MCG/ACT inhaler Inhale 2 puffs into the lungs every 4 (four) hours as needed for up to 10 days for wheezing or shortness of breath. 10/23/23 11/02/23 Yes Meliton Rattan, PA  azithromycin (ZITHROMAX) 250 MG tablet Take 1 tablet (250 mg total) by mouth daily. Take first 2 tablets together, then 1 every day until finished. 10/23/23  Yes Meliton Rattan, PA  carvedilol (COREG) 12.5 MG tablet TAKE 1 TABLET BY MOUTH TWICE A DAY WITH A MEAL 06/21/15   Eleonore Chiquito  F, MD  Diclofenac Potassium (CAMBIA) 50 MG PACK Take 50 mg by mouth daily as needed (migraines). Mix in 4 oz water and drink    [provider]  doxylamine, Sleep, (UNISOM) 25 MG tablet Take 25 mg by mouth at bedtime as needed for sleep.     [provider]  Gabapentin, Once-Daily, (GRALISE) 600 MG TABS Take 1,200 mg by mouth at bedtime.    [provider]  levothyroxine (SYNTHROID, LEVOTHROID) 50 MCG tablet Take 50 mcg by mouth daily before breakfast.    [provider]  Menthol, Topical Analgesic, (BIOFREEZE EX) Apply  1 application topically every hour as needed (pain).    [provider]  oxyCODONE (OXY IR/ROXICODONE) 5 MG immediate release tablet 1-2 po Q4-6 prn 10/21/15   Donalee Citrin, MD  oxyCODONE-acetaminophen (PERCOCET) 10-325 MG per tablet Take 1 tablet by mouth 4 (four) times daily. Prescribed by Dr. Marca Ancona, [facility] (scheduled)    [provider]  tiZANidine (ZANAFLEX) 4 MG capsule Take 12 mg by mouth at bedtime.  09/01/13   [provider]    Family History Family History  Problem Relation Age of Onset   Asthma Mother    Hypertension Mother    Hypothyroidism Mother    Hypertension Father    Restless legs syndrome Father    Sleep apnea Father    Colon polyps Father     Social History Social History   Tobacco Use   Smoking status: Every Day    Current packs/day: 1.00    Average packs/day: 1 pack/day for 13.0 years (13.0 ttl pk-yrs)    Types: Cigarettes   Smokeless tobacco: Never  Vaping Use   Vaping status: Some Days  Substance Use Topics   Alcohol use: No   Drug use: No     Allergies   Clarithromycin, Dicyclomine hcl, Nsaids, Sulfonamide derivatives, Lisinopril, and Prednisone   Review of Systems Review of Systems  Constitutional:  Positive for chills and diaphoresis. Negative for fatigue and fever.  HENT:  Negative for congestion, ear discharge and sore throat.   Respiratory:  Positive for cough. Negative for shortness of breath.   Cardiovascular:  Positive for chest pain (Feels burning in her chest when she coughs).  Gastrointestinal:  Negative for abdominal pain, diarrhea, nausea and vomiting.  Neurological:  Positive for headaches. Negative for light-headedness.     Physical Exam Triage Vital Signs ED Triage Vitals  Encounter Vitals Group     BP 10/23/23 1401 129/87     Systolic BP Percentile --      Diastolic BP Percentile --      Pulse Rate 10/23/23 1401 66     Resp 10/23/23 1401 20     Temp 10/23/23 1401 97.8 F (36.6 C)      Temp Source 10/23/23 1401 Oral     SpO2 10/23/23 1401 94 %     Weight --      Height --      Head Circumference --      Peak Flow --      Pain Score 10/23/23 1358 6     Pain Loc --      Pain Education --      Exclude from Growth Chart --    No data found.  Updated Vital Signs BP 129/87 (BP Location: Right Arm) Comment (BP Location): large cuff  Pulse 66   Temp 97.8 F (36.6 C) (Oral)   Resp 20   LMP 08/18/2023   SpO2  94%   Visual Acuity Right Eye Distance:   Left Eye Distance:   Bilateral Distance:    Right Eye Near:   Left Eye Near:    Bilateral Near:     Physical Exam Vitals and nursing note reviewed.  Constitutional:      Appearance: She is obese. She is not ill-appearing.  HENT:     Head: Normocephalic and atraumatic.     Right Ear: Tympanic membrane and ear canal normal.     Left Ear: Tympanic membrane and ear canal normal.     Nose: No rhinorrhea.     Mouth/Throat:     Pharynx: No posterior oropharyngeal erythema.  Cardiovascular:     Rate and Rhythm: Normal rate and regular rhythm.     Heart sounds: Normal heart sounds.  Pulmonary:     Effort: Pulmonary effort is normal. No respiratory distress.     Breath sounds: Rhonchi present. No wheezing.  Skin:    General: Skin is warm and dry.  Neurological:     Mental Status: She is alert and oriented to person, place, and time.      UC Treatments / Results  Labs (all labs ordered are listed, but only abnormal results are displayed) Labs Reviewed - No data to display  EKG   Radiology No results found.  Procedures Procedures (including critical care time)  Medications Ordered in UC Medications  triamcinolone acetonide (KENALOG-40) injection 40 mg (has no administration in time range)    Initial Impression / Assessment and Plan / UC Course  I have reviewed the triage vital signs and the nursing notes.  Pertinent labs & imaging results that were available during my care of the patient were  reviewed by me and considered in my medical decision making (see chart for details).     47 year old smoker with onset of cough, chest congestion and burning in her chest when she coughs.  She has rhonchi on exam with a deep cough repeat pulse ox was 96%, heart rate is normal, will treat with antibiotics and steroids she was encouraged not to smoke.  Follow-up with PCP, ED precautions reviewed Final Clinical Impressions(s) / UC Diagnoses   Final diagnoses:  Acute bronchitis, unspecified organism   Discharge Instructions   None    ED Prescriptions     Medication Sig Dispense Auth. Provider   azithromycin (ZITHROMAX) 250 MG tablet Take 1 tablet (250 mg total) by mouth daily. Take first 2 tablets together, then 1 every day until finished. 6 tablet Meliton Rattan, PA   albuterol (VENTOLIN HFA) 108 (90 Base) MCG/ACT inhaler Inhale 2 puffs into the lungs every 4 (four) hours as needed for up to 10 days for wheezing or shortness of breath. 1 each Meliton Rattan, PA      PDMP not reviewed this encounter.   Meliton Rattan, [place] 10/23/23 1424
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
## 2023-10-24 ENCOUNTER — Telehealth (HOSPITAL_BASED_OUTPATIENT_CLINIC_OR_DEPARTMENT_OTHER): Payer: Self-pay | Admitting: Physician Assistant

## 2023-10-24 MED ORDER — LEVALBUTEROL TARTRATE 45 MCG/ACT IN AERO: 2.0000 | INHALATION_SPRAY | Freq: Four times a day (QID) | RESPIRATORY_TRACT | 12 refills | Status: DC | PRN

## 2023-10-24 NOTE — Telephone Encounter (Signed)
Received fax from [facility].  Recommends leave albuterol which is covered by her insurance.  New prescription sent to her pharmacy.  Called patient informed of the change

## 2023-12-20 ENCOUNTER — Other Ambulatory Visit (HOSPITAL_BASED_OUTPATIENT_CLINIC_OR_DEPARTMENT_OTHER): Payer: Self-pay

## 2023-12-20 MED ORDER — OXYCODONE-ACETAMINOPHEN 7.5-325 MG PO TABS
1.0000 | ORAL_TABLET | Freq: Four times a day (QID) | ORAL | 0 refills | Status: AC
  Filled 2023-12-20 (×2): qty 16, 4d supply, fill #0

## 2024-02-17 ENCOUNTER — Other Ambulatory Visit (HOSPITAL_COMMUNITY): Payer: Self-pay

## 2024-02-17 MED ORDER — LEVOTHYROXINE SODIUM 112 MCG PO TABS
112.0000 ug | ORAL_TABLET | Freq: Every morning | ORAL | 0 refills | Status: DC
  Filled 2024-02-17 – 2024-02-29 (×2): qty 90, 90d supply, fill #0

## 2024-02-28 ENCOUNTER — Other Ambulatory Visit (HOSPITAL_COMMUNITY): Payer: Self-pay

## 2024-02-28 MED ORDER — WEGOVY 0.25 MG/0.5ML ~~LOC~~ SOAJ
0.5000 mL | SUBCUTANEOUS | 11 refills | Status: DC
  Filled 2024-02-28 – 2024-03-10 (×4): qty 2, 28d supply, fill #0

## 2024-02-29 ENCOUNTER — Other Ambulatory Visit (HOSPITAL_BASED_OUTPATIENT_CLINIC_OR_DEPARTMENT_OTHER): Payer: Self-pay

## 2024-02-29 ENCOUNTER — Other Ambulatory Visit (HOSPITAL_COMMUNITY): Payer: Self-pay

## 2024-02-29 MED ORDER — VITAMIN D (ERGOCALCIFEROL) 1.25 MG (50000 UNIT) PO CAPS
50000.0000 [IU] | ORAL_CAPSULE | ORAL | 3 refills | Status: DC
  Filled 2024-02-29: qty 12, 84d supply, fill #0

## 2024-02-29 MED ORDER — LEVOTHYROXINE SODIUM 125 MCG PO TABS
125.0000 ug | ORAL_TABLET | Freq: Every morning | ORAL | 3 refills | Status: DC
  Filled 2024-02-29: qty 90, 90d supply, fill #0

## 2024-03-01 ENCOUNTER — Other Ambulatory Visit (HOSPITAL_BASED_OUTPATIENT_CLINIC_OR_DEPARTMENT_OTHER): Payer: Self-pay

## 2024-03-02 ENCOUNTER — Other Ambulatory Visit (HOSPITAL_BASED_OUTPATIENT_CLINIC_OR_DEPARTMENT_OTHER): Payer: Self-pay

## 2024-03-03 ENCOUNTER — Other Ambulatory Visit (HOSPITAL_BASED_OUTPATIENT_CLINIC_OR_DEPARTMENT_OTHER): Payer: Self-pay

## 2024-03-06 ENCOUNTER — Other Ambulatory Visit (HOSPITAL_BASED_OUTPATIENT_CLINIC_OR_DEPARTMENT_OTHER): Payer: Self-pay

## 2024-03-07 ENCOUNTER — Other Ambulatory Visit (HOSPITAL_BASED_OUTPATIENT_CLINIC_OR_DEPARTMENT_OTHER): Payer: Self-pay

## 2024-03-10 ENCOUNTER — Other Ambulatory Visit (HOSPITAL_BASED_OUTPATIENT_CLINIC_OR_DEPARTMENT_OTHER): Payer: Self-pay

## 2024-03-13 ENCOUNTER — Other Ambulatory Visit (HOSPITAL_BASED_OUTPATIENT_CLINIC_OR_DEPARTMENT_OTHER): Payer: Self-pay

## 2024-04-14 ENCOUNTER — Other Ambulatory Visit: Payer: Self-pay

## 2024-04-14 ENCOUNTER — Emergency Department (HOSPITAL_COMMUNITY)

## 2024-04-14 ENCOUNTER — Encounter (HOSPITAL_COMMUNITY): Payer: Self-pay

## 2024-04-14 ENCOUNTER — Emergency Department (HOSPITAL_COMMUNITY)
Admission: EM | Admit: 2024-04-14 | Discharge: 2024-04-14 | Discharge status: Against Medical Advice | Attending: Emergency Medicine | Admitting: Emergency Medicine

## 2024-04-14 DIAGNOSIS — Y92410 Unspecified street and highway as the place of occurrence of the external cause: Secondary | ICD-10-CM | POA: Diagnosis not present

## 2024-04-14 DIAGNOSIS — I10 Essential (primary) hypertension: Secondary | ICD-10-CM | POA: Diagnosis not present

## 2024-04-14 DIAGNOSIS — Z5329 Procedure and treatment not carried out because of patient's decision for other reasons: Secondary | ICD-10-CM | POA: Diagnosis not present

## 2024-04-14 DIAGNOSIS — M545 Low back pain, unspecified: Secondary | ICD-10-CM | POA: Insufficient documentation

## 2024-04-14 DIAGNOSIS — E039 Hypothyroidism, unspecified: Secondary | ICD-10-CM | POA: Insufficient documentation

## 2024-04-14 DIAGNOSIS — R0789 Other chest pain: Secondary | ICD-10-CM | POA: Insufficient documentation

## 2024-04-14 DIAGNOSIS — Z79899 Other long term (current) drug therapy: Secondary | ICD-10-CM | POA: Diagnosis not present

## 2024-04-14 MED ORDER — OXYCODONE-ACETAMINOPHEN 5-325 MG PO TABS
1.0000 | ORAL_TABLET | ORAL | Status: DC | PRN
  Administered 2024-04-14: 1 via ORAL
  Filled 2024-04-14: qty 1
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
## 2024-04-14 NOTE — ED Notes (Signed)
 After speaking with EDP pt left AMA, this RN was made aware before she left.
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
## 2024-04-14 NOTE — ED Notes (Signed)
 Pt refused CT, EDP made aware.

## 2024-04-14 NOTE — ED Notes (Signed)
 Patient transported to X-ray

## 2024-04-19 ENCOUNTER — Other Ambulatory Visit (HOSPITAL_BASED_OUTPATIENT_CLINIC_OR_DEPARTMENT_OTHER): Payer: Self-pay

## 2024-04-19 MED ORDER — OXYCODONE-ACETAMINOPHEN 10-325 MG PO TABS
1.0000 | ORAL_TABLET | Freq: Four times a day (QID) | ORAL | 0 refills | Status: DC | PRN
  Filled 2024-04-19: qty 20, 5d supply, fill #0

## 2024-04-19 MED ORDER — DULOXETINE HCL 60 MG PO CPEP
60.0000 mg | ORAL_CAPSULE | Freq: Every day | ORAL | 3 refills | Status: DC
  Filled 2024-04-19: qty 90, 90d supply, fill #0
  Filled 2024-06-02: qty 90, 90d supply, fill #1

## 2024-04-27 ENCOUNTER — Other Ambulatory Visit (HOSPITAL_BASED_OUTPATIENT_CLINIC_OR_DEPARTMENT_OTHER): Payer: Self-pay

## 2024-04-27 MED ORDER — LEVOTHYROXINE SODIUM 137 MCG PO TABS
137.0000 ug | ORAL_TABLET | Freq: Every morning | ORAL | 3 refills | Status: DC
  Filled 2024-04-27: qty 90, 90d supply, fill #0

## 2024-05-02 ENCOUNTER — Other Ambulatory Visit (HOSPITAL_BASED_OUTPATIENT_CLINIC_OR_DEPARTMENT_OTHER): Payer: Self-pay

## 2024-05-02 MED ORDER — GABAPENTIN 600 MG PO TABS
600.0000 mg | ORAL_TABLET | Freq: Three times a day (TID) | ORAL | 5 refills | Status: AC
  Filled 2024-05-02 – 2024-05-03 (×2): qty 90, 30d supply, fill #0

## 2024-05-02 MED ORDER — TIZANIDINE HCL 4 MG PO TABS
4.0000 mg | ORAL_TABLET | Freq: Three times a day (TID) | ORAL | 2 refills | Status: AC | PRN
  Filled 2024-05-02 – 2024-05-03 (×2): qty 90, 30d supply, fill #0

## 2024-05-03 ENCOUNTER — Other Ambulatory Visit (HOSPITAL_BASED_OUTPATIENT_CLINIC_OR_DEPARTMENT_OTHER): Payer: Self-pay

## 2024-06-02 ENCOUNTER — Other Ambulatory Visit (HOSPITAL_BASED_OUTPATIENT_CLINIC_OR_DEPARTMENT_OTHER): Payer: Self-pay

## 2024-06-16 ENCOUNTER — Other Ambulatory Visit (HOSPITAL_BASED_OUTPATIENT_CLINIC_OR_DEPARTMENT_OTHER): Payer: Self-pay

## 2024-06-16 MED ORDER — DIPHENOXYLATE-ATROPINE 2.5-0.025 MG PO TABS
1.0000 | ORAL_TABLET | Freq: Four times a day (QID) | ORAL | 0 refills | Status: AC | PRN
  Filled 2024-06-16: qty 30, 8d supply, fill #0

## 2024-07-07 ENCOUNTER — Ambulatory Visit (HOSPITAL_BASED_OUTPATIENT_CLINIC_OR_DEPARTMENT_OTHER): Admit: 2024-07-07 | Discharge: 2024-07-07 | Disposition: A | Discharge status: Home / Self Care | Admitting: Radiology

## 2024-07-07 ENCOUNTER — Encounter (HOSPITAL_BASED_OUTPATIENT_CLINIC_OR_DEPARTMENT_OTHER): Payer: Self-pay

## 2024-07-07 ENCOUNTER — Ambulatory Visit (HOSPITAL_BASED_OUTPATIENT_CLINIC_OR_DEPARTMENT_OTHER): Admission: EM | Admit: 2024-07-07 | Discharge: 2024-07-07 | Disposition: A | Discharge status: Home / Self Care

## 2024-07-07 ENCOUNTER — Other Ambulatory Visit (HOSPITAL_BASED_OUTPATIENT_CLINIC_OR_DEPARTMENT_OTHER): Admitting: Radiology

## 2024-07-07 DIAGNOSIS — S0990XA Unspecified injury of head, initial encounter: Secondary | ICD-10-CM

## 2024-07-07 DIAGNOSIS — R296 Repeated falls: Secondary | ICD-10-CM

## 2024-07-07 DIAGNOSIS — W228XXA Striking against or struck by other objects, initial encounter: Secondary | ICD-10-CM

## 2024-07-07 NOTE — Discharge Instructions (Signed)
 Your CT scan was normal.  Recommend follow-up with your doctor for these sleepwalking episodes for referral. You can take Benadryl  or Zyrtec for the facial swelling.  You may have an allergy to something Follow-up as needed

## 2024-07-07 NOTE — ED Notes (Signed)
 Contacted [employer]. Spoke to [place]. Authorization Code #[id number]  Approval Code #[id number] 07/07/24-08/21/24.
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
## 2024-07-07 NOTE — ED Provider Notes (Signed)
 [facility]    CSN: [id number] Arrival date & time: 07/07/24  1311      History   Chief Complaint Chief Complaint  Patient presents with   Facial Swelling    HPI Julie [name] is a 48 y.o. female.   Patient is a 48 year old female who presents today for facial swelling  onset last night. No recent new medication. States has had this happen before.  Not sure if she is allergic to something.  She also has a history of sleepwalking.  Fell last night. Struck head on sink.  Woke up from the refrigerator.  Abrasion to bridge of nose. Swelling to area over left eye. No laceration. Unsure if loss of consciousness. Reports some blurred vision since fall.  Having pain to the left side of her head.  No N/V. Patient sleep walks and this has increased since loss of her husband in April.  Patient on multiple medications for pain.      Past Medical History:  Diagnosis Date   Arthritis    Bronchitis    Cervical disc disease    Furuncle    recurrent    GERD (gastroesophageal reflux disease)    Headache    Hypertension    Hypothyroidism    IBS (irritable bowel syndrome)    Insomnia    Morbid obesity (HCC)    Peptic ulcer disease    h. polyri 2003   Pneumonia    hx of    Patient Active Problem List   Diagnosis Date Noted   Pseudoarthrosis of cervical spine (HCC) 10/18/2015   Spinal stenosis of cervical region 11/07/2014   DISC DISEASE, CERVICAL 07/10/2010   INSOMNIA 04/10/2010   DIARRHEA 02/28/2010   ABDOMINAL PAIN, GENERALIZED 02/28/2010   IRRITABLE BOWEL SYNDROME 08/23/2009   Carbuncle and furuncle 02/15/2009   OBESITY, MORBID 02/05/2009   Essential hypertension 02/05/2009   GERD 02/05/2009   Peptic ulcer 02/05/2009    Past Surgical History:  Procedure Laterality Date   ANTERIOR CERVICAL DECOMP/DISCECTOMY FUSION N/A 11/07/2014   Procedure: ANTERIOR CERVICAL DECOMPRESSION/DISCECTOMY FUSION THREE-FOUR ,FOUR-FIVE,SIX SEVEN LONEY REMOVAL FIVE-SIX;  Surgeon:  Arley SHAUNNA Helling, MD;  Location: [facility] OR;  Service: Neurosurgery;  Laterality: N/A;   CARPAL TUNNEL RELEASE Left    CERVICAL SPINE SURGERY  12,15   x2   MOUTH SURGERY  10   gum work   POSTERIOR CERVICAL FUSION/FORAMINOTOMY N/A 10/18/2015   Procedure: Cervical three-four-Cervical four-five Cervical five-six Cervical six-seven Posterior Cervical Fusion with lateral mass fixation ;  Surgeon: Arley Helling, MD;  Location: [facility] OR[facility];  Service: Neurosurgery;  Laterality: N/A;   TONSILLECTOMY  2006    OB History   No obstetric history on file.      Home Medications    Prior to Admission medications   Medication Sig Start Date End Date Taking? Authorizing Provider  furosemide (LASIX) 20 MG tablet Take 20 mg by mouth daily. As needed 01/15/21  Yes [provider]  traMADol (ULTRAM) 50 MG tablet Take 50 mg by mouth every 6 (six) hours as needed for severe pain (pain score 7-10). 06/19/21  Yes [provider]  azithromycin  (ZITHROMAX ) 250 MG tablet Take 1 tablet (250 mg total) by mouth daily. Take first 2 tablets together, then 1 every day until finished. 10/23/23   Acevedo, Angela, PA  carvedilol  (COREG ) 12.5 MG tablet TAKE 1 TABLET BY MOUTH TWICE A DAY WITH A MEAL 06/21/15   Jame Maude FALCON, MD  Diclofenac  Potassium (CAMBIA ) 50 MG  PACK Take 50 mg by mouth daily as needed (migraines). Mix in 4 oz water and drink    [provider]  diphenoxylate -atropine  (LOMOTIL ) 2.5-0.025 MG tablet Take 1 tablet by mouth 4 (four) times daily as needed for diarrhea. 06/16/24     doxylamine , Sleep, (UNISOM ) 25 MG tablet Take 25 mg by mouth at bedtime as needed for sleep.     [provider]  gabapentin  (NEURONTIN ) 600 MG tablet Take 1 tablet (600 mg total) by mouth 3 (three) times daily. 02/04/24     Gabapentin , Once-Daily, (GRALISE ) 600 MG TABS Take 1,200 mg by mouth at bedtime.    [provider]  levalbuterol  (XOPENEX  HFA) 45 MCG/ACT inhaler Inhale 2 puffs into the lungs  every 6 (six) hours as needed for up to 10 days for wheezing or shortness of breath. 10/24/23 11/03/23  Acevedo, Angela, PA  levothyroxine  (SYNTHROID ) 137 MCG tablet Take 1 tablet (137 mcg total) by mouth in the morning. 04/27/24     Menthol , Topical Analgesic, (BIOFREEZE EX) Apply 1 application topically every hour as needed (pain).    [provider]  oxyCODONE -acetaminophen  (PERCOCET) 7.5-325 MG tablet Take 1 tablet by mouth every 6 (six) hours. 12/20/23     tiZANidine  (ZANAFLEX ) 4 MG tablet Take 1 tablet (4 mg total) by mouth every 8 (eight) hours as needed. 03/09/24       Family History Family History  Problem Relation Age of Onset   Asthma Mother    Hypertension Mother    Hypothyroidism Mother    Hypertension Father    Restless legs syndrome Father    Sleep apnea Father    Colon polyps Father     Social History Social History   Tobacco Use   Smoking status: Every Day    Current packs/day: 1.00    Average packs/day: 1 pack/day for 13.0 years (13.0 ttl pk-yrs)    Types: Cigarettes   Smokeless tobacco: Never  Vaping Use   Vaping status: Some Days  Substance Use Topics   Alcohol use: No   Drug use: No     Allergies   Clarithromycin, Dicyclomine hcl, Nsaids, Sulfonamide derivatives, Lisinopril, and Prednisone   Review of Systems Review of Systems See HPI  Physical Exam Triage Vital Signs ED Triage Vitals  Encounter Vitals Group     BP 07/07/24 1327 137/73     Girls Systolic BP Percentile --      Girls Diastolic BP Percentile --      Boys Systolic BP Percentile --      Boys Diastolic BP Percentile --      Pulse Rate 07/07/24 1327 93     Resp 07/07/24 1327 20     Temp 07/07/24 1327 98.5 F (36.9 C)     Temp Source 07/07/24 1327 Oral     SpO2 07/07/24 1327 98 %     Weight --      Height --      Head Circumference --      Peak Flow --      Pain Score 07/07/24 1330 5     Pain Loc --      Pain Education --      Exclude from Growth Chart --    No  data found.  Updated Vital Signs BP 137/73 (BP Location: Right Arm)   Pulse 93   Temp 98.5 F (36.9 C) (Oral)   Resp 20   SpO2 98%   Visual Acuity Right Eye Distance:   Left  Eye Distance:   Bilateral Distance:    Right Eye Near:   Left Eye Near:    Bilateral Near:     Physical Exam Vitals and nursing note reviewed.  Constitutional:      General: She is not in acute distress.    Appearance: Normal appearance. She is not ill-appearing, toxic-appearing or diaphoretic.  HENT:     Head: Normocephalic and atraumatic.     Nose:     Comments: Mild swelling to bridge of nose Eyes:     Conjunctiva/sclera: Conjunctivae normal.     Comments: Bilateral upper and lower lid swelling and erythema.  Pulmonary:     Effort: Pulmonary effort is normal.  Musculoskeletal:        General: Normal range of motion.  Skin:    General: Skin is warm and dry.  Neurological:     General: No focal deficit present.     Mental Status: She is alert.     Cranial Nerves: No cranial nerve deficit.     Motor: No weakness.     Gait: Gait normal.  Psychiatric:        Mood and Affect: Mood normal.      UC Treatments / Results  Labs (all labs ordered are listed, but only abnormal results are displayed) Labs Reviewed - No data to display  EKG   Radiology CT Head Wo Contrast Result Date: 07/07/2024 CLINICAL DATA:  Moderate to severe head trauma EXAM: CT HEAD WITHOUT CONTRAST TECHNIQUE: Contiguous axial images were obtained from the base of the skull through the vertex without intravenous contrast. RADIATION DOSE REDUCTION: This exam was performed according to the departmental dose-optimization program which includes automated exposure control, adjustment of the mA and/or kV according to patient size and/or use of iterative reconstruction technique. COMPARISON:  12/19/2023 FINDINGS: Brain: No acute infarct or hemorrhage. The lateral ventricles and midline structures are stable. No acute extra-axial fluid  collections. No mass effect. Vascular: No hyperdense vessel or unexpected calcification. Skull: Normal. Negative for fracture or focal lesion. Sinuses/Orbits: No acute finding. Other: None. IMPRESSION: 1. No acute intracranial process. Electronically Signed   By: Ozell Daring M.D.   On: 07/07/2024 15:48    Procedures Procedures (including critical care time)  Medications Ordered in UC Medications - No data to display  Initial Impression / Assessment and Plan / UC Course  I have reviewed the triage vital signs and the nursing notes.  Pertinent labs & imaging results that were available during my care of the patient were reviewed by me and considered in my medical decision making (see chart for details).     Head injury-CT scan without any acute intracranial process.  Patient having a lot of falls due to sleepwalking in the middle of the night.  Recommend needs referral to sleep medicine or neurology. Plans to follow-up with PCP for referral  Allergic reaction-unsure of cause but does have facial swelling and lip tingling Recommend Benadryl  or Zyrtec for this. May need to get some allergy testing done to narrow down the cause ER if symptoms worsen or involve breathing or oral swelling.  Currently not having this.  Stable upon discharge with no concerns or red flags.  Final Clinical Impressions(s) / UC Diagnoses   Final diagnoses:  Injury of head, initial encounter     Discharge Instructions      Your CT scan was normal.  Recommend follow-up with your doctor for these sleepwalking episodes for referral. You can take Benadryl  or Zyrtec for  the facial swelling.  You may have an allergy to something Follow-up as needed     ED Prescriptions   None    PDMP not reviewed this encounter.   Adah Wilbert LABOR, FNP 07/07/24 1600

## 2024-07-17 ENCOUNTER — Ambulatory Visit (HOSPITAL_BASED_OUTPATIENT_CLINIC_OR_DEPARTMENT_OTHER): Admitting: Family Medicine

## 2024-07-20 ENCOUNTER — Encounter (HOSPITAL_BASED_OUTPATIENT_CLINIC_OR_DEPARTMENT_OTHER): Payer: Self-pay | Admitting: Family Medicine

## 2024-07-20 ENCOUNTER — Ambulatory Visit (INDEPENDENT_AMBULATORY_CARE_PROVIDER_SITE_OTHER): Admitting: Family Medicine

## 2024-07-20 ENCOUNTER — Other Ambulatory Visit (HOSPITAL_BASED_OUTPATIENT_CLINIC_OR_DEPARTMENT_OTHER): Payer: Self-pay

## 2024-07-20 VITALS — BP 112/78 | HR 69 | Temp 97.9°F | Resp 16 | Ht 64.96 in | Wt 292.9 lb

## 2024-07-20 DIAGNOSIS — Z8711 Personal history of peptic ulcer disease: Secondary | ICD-10-CM | POA: Insufficient documentation

## 2024-07-20 DIAGNOSIS — E039 Hypothyroidism, unspecified: Secondary | ICD-10-CM

## 2024-07-20 DIAGNOSIS — R5382 Chronic fatigue, unspecified: Secondary | ICD-10-CM | POA: Diagnosis not present

## 2024-07-20 DIAGNOSIS — F324 Major depressive disorder, single episode, in partial remission: Secondary | ICD-10-CM | POA: Insufficient documentation

## 2024-07-20 DIAGNOSIS — G4733 Obstructive sleep apnea (adult) (pediatric): Secondary | ICD-10-CM | POA: Diagnosis not present

## 2024-07-20 DIAGNOSIS — Z720 Tobacco use: Secondary | ICD-10-CM | POA: Insufficient documentation

## 2024-07-20 DIAGNOSIS — E785 Hyperlipidemia, unspecified: Secondary | ICD-10-CM

## 2024-07-20 MED ORDER — DULOXETINE HCL 30 MG PO CPEP
30.0000 mg | ORAL_CAPSULE | Freq: Every day | ORAL | 2 refills | Status: DC
  Filled 2024-08-14: qty 30, 30d supply, fill #0

## 2024-07-20 MED ORDER — DULOXETINE HCL 30 MG PO CPEP
30.0000 mg | ORAL_CAPSULE | Freq: Every day | ORAL | 2 refills | Status: DC
  Filled 2024-07-20: qty 30, 30d supply, fill #0

## 2024-07-20 MED ORDER — LEVOTHYROXINE SODIUM 137 MCG PO TABS
137.0000 ug | ORAL_TABLET | Freq: Every morning | ORAL | 0 refills | Status: DC
  Filled 2024-07-20: qty 90, 90d supply, fill #0

## 2024-07-20 NOTE — Assessment & Plan Note (Signed)
 Counseled on cessation briefly today.

## 2024-07-20 NOTE — Assessment & Plan Note (Signed)
 Continue to review old records, and arrange for fasting labs soon.

## 2024-07-20 NOTE — Assessment & Plan Note (Signed)
 States she has an upcoming EGD.  I encouraged a Colonoscopy as well.

## 2024-07-20 NOTE — Assessment & Plan Note (Signed)
 She agrees to a home sleep study.  I advised her that she will likely feel much better once this issue has been addressed.

## 2024-07-20 NOTE — Progress Notes (Signed)
 Established Patient Office Visit  Subjective   Patient ID: Julie [name], female    DOB: [birth year]  Age: 48 y.o. MRN: [id number]  Chief Complaint  Patient presents with   Establish Care    Establish Care    Medication Refill    Need refill on Synthroid    F/u as above.  New to my practice.  Recently lost her husband from cancer and she has understandably struggled with the grief.  She has been wisely getting therapy to help with this.  Extended discussion.  Medication Refill Pertinent negatives include no chest pain.    Past Medical History:  Diagnosis Date   GERD (gastroesophageal reflux disease)    History of stomach ulcers    distant past   Hypertension    Hypothyroidism    IBS (irritable bowel syndrome)    Insomnia    Morbid obesity (HCC)    Osteoarthritis    Tobacco abuse     Outpatient Encounter Medications as of 07/20/2024  Medication Sig   azithromycin (ZITHROMAX) 250 MG tablet Take 1 tablet (250 mg total) by mouth daily. Take first 2 tablets together, then 1 every day until finished.   carvedilol (COREG) 12.5 MG tablet TAKE 1 TABLET BY MOUTH TWICE A DAY WITH A MEAL   Diclofenac Potassium (CAMBIA) 50 MG PACK Take 50 mg by mouth daily as needed (migraines). Mix in 4 oz water and drink   diphenoxylate-atropine (LOMOTIL) 2.5-0.025 MG tablet Take 1 tablet by mouth 4 (four) times daily as needed for diarrhea.   doxylamine, Sleep, (UNISOM) 25 MG tablet Take 25 mg by mouth at bedtime as needed for sleep.    furosemide (LASIX) 20 MG tablet Take 20 mg by mouth daily. As needed   gabapentin (NEURONTIN) 600 MG tablet Take 1 tablet (600 mg total) by mouth 3 (three) times daily.   Gabapentin, Once-Daily, (GRALISE) 600 MG TABS Take 1,200 mg by mouth at bedtime.   levalbuterol (XOPENEX HFA) 45 MCG/ACT inhaler Inhale 2 puffs into the lungs every 6 (six) hours as needed for up to 10 days for wheezing or shortness of breath.   levothyroxine (SYNTHROID) 137 MCG tablet Take  137 mcg by mouth.   oxyCODONE-acetaminophen (PERCOCET) 7.5-325 MG tablet Take 1 tablet by mouth every 6 (six) hours.   tiZANidine (ZANAFLEX) 4 MG tablet Take 1 tablet (4 mg total) by mouth every 8 (eight) hours as needed.   [DISCONTINUED] DULoxetine (CYMBALTA) 30 MG capsule Take 1 capsule (30 mg total) by mouth daily.   [DISCONTINUED] levothyroxine (SYNTHROID) 137 MCG tablet Take 1 tablet (137 mcg total) by mouth in the morning.   [DISCONTINUED] Menthol, Topical Analgesic, (BIOFREEZE EX) Apply 1 application topically every hour as needed (pain).   [DISCONTINUED] traMADol (ULTRAM) 50 MG tablet Take 50 mg by mouth every 6 (six) hours as needed for severe pain (pain score 7-10).   DULoxetine (CYMBALTA) 30 MG capsule Take 1 capsule (30 mg total) by mouth daily.   levothyroxine (SYNTHROID) 137 MCG tablet Take 1 tablet (137 mcg total) by mouth in the morning.   No facility-administered encounter medications on file as of 07/20/2024.    Social History   Tobacco Use   Smoking status: Every Day    Current packs/day: 1.00    Average packs/day: 1 pack/day for 13.0 years (13.0 ttl pk-yrs)    Types: Cigarettes   Smokeless tobacco: Never  Vaping Use   Vaping status: Some Days  Substance Use Topics   Alcohol use: No  Drug use: No      Review of Systems  Constitutional:  Positive for malaise/fatigue. Negative for weight loss.  Cardiovascular:  Negative for chest pain and palpitations.  Neurological:  Negative for dizziness.  Psychiatric/Behavioral:  Positive for depression. Negative for hallucinations, memory loss, substance abuse and suicidal ideas. The patient is nervous/anxious. The patient does not have insomnia.       Objective:     BP 112/78 (BP Location: Right Arm, Patient Position: Standing, Cuff Size: Normal)   Pulse 69   Temp 97.9 F (36.6 C) (Oral)   Resp 16   Ht 5' 4.96 (1.65 m)   Wt 292 lb 14.4 oz (132.9 kg)   SpO2 91%   BMI 48.80 kg/m    Physical  Exam Constitutional:      General: She is not in acute distress.    Appearance: Normal appearance. She is obese.  HENT:     Head: Normocephalic.  Neck:     Vascular: No carotid bruit.  Cardiovascular:     Rate and Rhythm: Normal rate and regular rhythm.     Pulses: Normal pulses.     Heart sounds: Normal heart sounds.  Pulmonary:     Effort: Pulmonary effort is normal.     Breath sounds: Normal breath sounds.  Abdominal:     General: Bowel sounds are normal.     Palpations: Abdomen is soft.  Musculoskeletal:     Cervical back: Neck supple. No tenderness.     Right lower leg: No edema.     Left lower leg: No edema.  Neurological:     Mental Status: She is alert.  Psychiatric:     Comments: Pleasant, anxious      No results found for any visits on 07/20/24.    The 10-year ASCVD risk score (Arnett DK, et al., 2019) is: 3%    Assessment & Plan:  OSA (obstructive sleep apnea) Assessment & Plan: She agrees to a home sleep study.  I advised her that she will likely feel much better once this issue has been addressed.  Orders: -     Ambulatory referral to Sleep Studies  Depression, major, single episode, in partial remission (HCC) Assessment & Plan: Trial of Duloxetine .  Continue counseling.  Orders: -     DULoxetine  HCl; Take 1 capsule (30 mg total) by mouth daily.  Dispense: 30 capsule; Refill: 2  History of stomach ulcers Assessment & Plan: States she has an upcoming EGD.  I encouraged a Colonoscopy as well.   Tobacco abuse Assessment & Plan: Counseled on cessation briefly today.   Acquired hypothyroidism Assessment & Plan: Continue to review old records, and arrange for fasting labs soon.  Orders: -     Levothyroxine  Sodium; Take 1 tablet (137 mcg total) by mouth in the morning.  Dispense: 90 tablet; Refill: 0 -     TSH; Future  Dyslipidemia -     Lipid panel; Future  Chronic fatigue -     CBC with Differential/Platelet; Future -      Comprehensive metabolic panel with GFR; Future -     Vitamin B12; Future    Return in about 4 weeks (around 08/17/2024) for chronic follow-up.    REDDING PONCE NORLEEN FALCON., MD

## 2024-07-20 NOTE — Assessment & Plan Note (Signed)
 Trial of Duloxetine.  Continue counseling.

## 2024-07-21 ENCOUNTER — Other Ambulatory Visit (HOSPITAL_BASED_OUTPATIENT_CLINIC_OR_DEPARTMENT_OTHER)

## 2024-07-24 ENCOUNTER — Other Ambulatory Visit (HOSPITAL_BASED_OUTPATIENT_CLINIC_OR_DEPARTMENT_OTHER)

## 2024-08-14 ENCOUNTER — Other Ambulatory Visit (HOSPITAL_BASED_OUTPATIENT_CLINIC_OR_DEPARTMENT_OTHER): Payer: Self-pay

## 2024-08-18 ENCOUNTER — Ambulatory Visit (INDEPENDENT_AMBULATORY_CARE_PROVIDER_SITE_OTHER): Admitting: Family Medicine

## 2024-08-18 ENCOUNTER — Encounter (HOSPITAL_BASED_OUTPATIENT_CLINIC_OR_DEPARTMENT_OTHER): Payer: Self-pay | Admitting: Family Medicine

## 2024-08-18 ENCOUNTER — Other Ambulatory Visit (HOSPITAL_BASED_OUTPATIENT_CLINIC_OR_DEPARTMENT_OTHER)

## 2024-08-18 VITALS — BP 138/87 | HR 73 | Temp 97.3°F | Resp 17 | Wt 299.8 lb

## 2024-08-18 DIAGNOSIS — R5383 Other fatigue: Secondary | ICD-10-CM

## 2024-08-18 DIAGNOSIS — F513 Sleepwalking [somnambulism]: Secondary | ICD-10-CM

## 2024-08-18 DIAGNOSIS — E785 Hyperlipidemia, unspecified: Secondary | ICD-10-CM

## 2024-08-18 DIAGNOSIS — Z1231 Encounter for screening mammogram for malignant neoplasm of breast: Secondary | ICD-10-CM

## 2024-08-18 DIAGNOSIS — I10 Essential (primary) hypertension: Secondary | ICD-10-CM | POA: Diagnosis not present

## 2024-08-18 DIAGNOSIS — E039 Hypothyroidism, unspecified: Secondary | ICD-10-CM

## 2024-08-18 NOTE — Assessment & Plan Note (Signed)
Reevaluate today

## 2024-08-18 NOTE — Assessment & Plan Note (Signed)
 Controlled.  DASH diet.  Low impact exercise.

## 2024-08-18 NOTE — Progress Notes (Signed)
 Established Patient Office Visit  Subjective   Patient ID: Julie [name], female    DOB: 11-Apr-1976  Age: 48 y.o. MRN: [id number]  Chief Complaint  Patient presents with   Follow-up    Follow-up visit    F/u as above.  Please see last note for details.  She is getting labs here shortly.  Struggling with Sleep walking and will change her home sleep study to a consultation with Dr. Mardee locally.  Admits she is way overdue for a Pap.    Past Medical History:  Diagnosis Date   GERD (gastroesophageal reflux disease)    History of stomach ulcers    distant past   Hypertension    Hypothyroidism    IBS (irritable bowel syndrome)    Insomnia    Morbid obesity (HCC)    Osteoarthritis    Tobacco abuse    Unable to quit    Outpatient Encounter Medications as of 08/18/2024  Medication Sig   azithromycin  (ZITHROMAX ) 250 MG tablet Take 1 tablet (250 mg total) by mouth daily. Take first 2 tablets together, then 1 every day until finished.   carvedilol  (COREG ) 12.5 MG tablet TAKE 1 TABLET BY MOUTH TWICE A DAY WITH A MEAL   Diclofenac  Potassium (CAMBIA ) 50 MG PACK Take 50 mg by mouth daily as needed (migraines). Mix in 4 oz water and drink   diphenoxylate -atropine  (LOMOTIL ) 2.5-0.025 MG tablet Take 1 tablet by mouth 4 (four) times daily as needed for diarrhea.   doxylamine , Sleep, (UNISOM ) 25 MG tablet Take 25 mg by mouth at bedtime as needed for sleep.    DULoxetine  (CYMBALTA ) 30 MG capsule Take 1 capsule (30 mg total) by mouth daily.   furosemide (LASIX) 20 MG tablet Take 20 mg by mouth daily. As needed   gabapentin  (NEURONTIN ) 600 MG tablet Take 1 tablet (600 mg total) by mouth 3 (three) times daily.   Gabapentin , Once-Daily, (GRALISE ) 600 MG TABS Take 1,200 mg by mouth at bedtime.   levalbuterol  (XOPENEX  HFA) 45 MCG/ACT inhaler Inhale 2 puffs into the lungs every 6 (six) hours as needed for up to 10 days for wheezing or shortness of breath.   levothyroxine  (SYNTHROID ) 137 MCG  tablet Take 137 mcg by mouth.   levothyroxine  (SYNTHROID ) 137 MCG tablet Take 1 tablet (137 mcg total) by mouth in the morning.   oxyCODONE -acetaminophen  (PERCOCET) 7.5-325 MG tablet Take 1 tablet by mouth every 6 (six) hours.   tiZANidine  (ZANAFLEX ) 4 MG tablet Take 1 tablet (4 mg total) by mouth every 8 (eight) hours as needed.   [START ON 08/29/2024] traMADol (ULTRAM) 50 MG tablet Take 50 mg by mouth.   No facility-administered encounter medications on file as of 08/18/2024.    Social History   Tobacco Use   Smoking status: Every Day    Current packs/day: 1.00    Average packs/day: 1 pack/day for 13.0 years (13.0 ttl pk-yrs)    Types: Cigarettes   Smokeless tobacco: Never  Vaping Use   Vaping status: Some Days  Substance Use Topics   Alcohol use: No   Drug use: No      Review of Systems  Constitutional:  Negative for diaphoresis, fever, malaise/fatigue and weight loss.  Respiratory:  Negative for cough, shortness of breath and wheezing.   Cardiovascular:  Negative for chest pain, palpitations, orthopnea, claudication, leg swelling and PND.      Objective:     BP 138/87 (Cuff Size: Normal)   Pulse 73   Temp ROLLEN)  97.3 F (36.3 C) (Oral)   Resp 17   Wt 299 lb 12.8 oz (136 kg)   SpO2 93%   BMI 49.95 kg/m    Physical Exam Constitutional:      General: She is not in acute distress.    Appearance: Normal appearance. She is obese.  HENT:     Head: Normocephalic.  Neck:     Vascular: No carotid bruit.  Cardiovascular:     Rate and Rhythm: Normal rate and regular rhythm.     Pulses: Normal pulses.     Heart sounds: Normal heart sounds.  Pulmonary:     Effort: Pulmonary effort is normal.     Breath sounds: Normal breath sounds.  Abdominal:     General: Bowel sounds are normal.     Palpations: Abdomen is soft.  Musculoskeletal:     Cervical back: Neck supple. No tenderness.     Right lower leg: No edema.     Left lower leg: No edema.  Neurological:     Mental  Status: She is alert.      No results found for any visits on 08/18/24.    The 10-year ASCVD risk score (Arnett DK, et al., 2019) is: 4.5%    Assessment & Plan:  Essential hypertension Assessment & Plan: Controlled.  DASH diet.  Low impact exercise.  Orders: -     CBC with Differential/Platelet -     Comprehensive metabolic panel with GFR  Sleep walking disorder -     Ambulatory referral to Sleep Studies  Dyslipidemia Assessment & Plan: Reevaluate today.  Orders: -     Lipid panel  Hypothyroidism, unspecified type -     TSH  Fatigue, unspecified type -     Vitamin B12  Breast cancer screening by mammogram -     3D Screening Mammogram, Left and Right; Future    Return in about 3 months (around 11/17/2024) for physical.    REDDING PONCE NORLEEN FALCON., MD

## 2024-08-19 LAB — VITAMIN B12: Vitamin B-12: 201 pg/mL — ABNORMAL LOW (ref 232–1245)

## 2024-08-19 LAB — COMPREHENSIVE METABOLIC PANEL WITH GFR
ALT: 27 IU/L (ref 0–32)
AST: 37 IU/L (ref 0–40)
Albumin: 4.1 g/dL (ref 3.9–4.9)
Alkaline Phosphatase: 101 IU/L (ref 44–121)
BUN/Creatinine Ratio: 7 — ABNORMAL LOW (ref 9–23)
BUN: 4 mg/dL — ABNORMAL LOW (ref 6–24)
Bilirubin Total: 0.2 mg/dL (ref 0.0–1.2)
CO2: 28 mmol/L (ref 20–29)
Calcium: 9.3 mg/dL (ref 8.7–10.2)
Chloride: 97 mmol/L (ref 96–106)
Creatinine, Ser: 0.6 mg/dL (ref 0.57–1.00)
Globulin, Total: 2.4 g/dL (ref 1.5–4.5)
Glucose: 95 mg/dL (ref 70–99)
Potassium: 4.4 mmol/L (ref 3.5–5.2)
Sodium: 137 mmol/L (ref 134–144)
Total Protein: 6.5 g/dL (ref 6.0–8.5)
eGFR: 112 mL/min/1.73 (ref >59)

## 2024-08-19 LAB — CBC WITH DIFFERENTIAL/PLATELET
Basophils Absolute: 0 x10E3/uL (ref 0.0–0.2)
Basos: 0 %
EOS (ABSOLUTE): 0.2 x10E3/uL (ref 0.0–0.4)
Eos: 3 %
Hematocrit: 41.1 % (ref 34.0–46.6)
Hemoglobin: 13.8 g/dL (ref 11.1–15.9)
Immature Grans (Abs): 0 x10E3/uL (ref 0.0–0.1)
Immature Granulocytes: 0 %
Lymphocytes Absolute: 2.6 x10E3/uL (ref 0.7–3.1)
Lymphs: 33 %
MCH: 31.7 pg (ref 26.6–33.0)
MCHC: 33.6 g/dL (ref 31.5–35.7)
MCV: 95 fL (ref 79–97)
Monocytes Absolute: 0.4 x10E3/uL (ref 0.1–0.9)
Monocytes: 6 %
Neutrophils Absolute: 4.5 x10E3/uL (ref 1.4–7.0)
Neutrophils: 58 %
Platelets: 326 x10E3/uL (ref 150–450)
RBC: 4.35 x10E6/uL (ref 3.77–5.28)
RDW: 12.8 % (ref 11.7–15.4)
WBC: 7.7 x10E3/uL (ref 3.4–10.8)

## 2024-08-19 LAB — LIPID PANEL
Chol/HDL Ratio: 3.5 ratio (ref 0.0–4.4)
Cholesterol, Total: 229 mg/dL — ABNORMAL HIGH (ref 100–199)
HDL: 66 mg/dL (ref >39)
LDL Chol Calc (NIH): 147 mg/dL — ABNORMAL HIGH (ref 0–99)
Triglycerides: 90 mg/dL (ref 0–149)
VLDL Cholesterol Cal: 16 mg/dL (ref 5–40)

## 2024-08-19 LAB — TSH: TSH: 3.41 u[IU]/mL (ref 0.450–4.500)

## 2024-08-21 ENCOUNTER — Ambulatory Visit (HOSPITAL_BASED_OUTPATIENT_CLINIC_OR_DEPARTMENT_OTHER): Payer: Self-pay | Admitting: Family Medicine

## 2024-08-22 ENCOUNTER — Other Ambulatory Visit (HOSPITAL_BASED_OUTPATIENT_CLINIC_OR_DEPARTMENT_OTHER): Payer: Self-pay

## 2024-08-22 ENCOUNTER — Other Ambulatory Visit (HOSPITAL_BASED_OUTPATIENT_CLINIC_OR_DEPARTMENT_OTHER): Payer: Self-pay | Admitting: Family Medicine

## 2024-08-22 DIAGNOSIS — E785 Hyperlipidemia, unspecified: Secondary | ICD-10-CM

## 2024-08-22 MED ORDER — PRAVASTATIN SODIUM 20 MG PO TABS
20.0000 mg | ORAL_TABLET | Freq: Every day | ORAL | 2 refills | Status: AC
  Filled 2024-08-22 – 2024-09-08 (×2): qty 30, 30d supply, fill #0
  Filled 2024-10-05: qty 30, 30d supply, fill #1

## 2024-08-25 ENCOUNTER — Encounter (HOSPITAL_BASED_OUTPATIENT_CLINIC_OR_DEPARTMENT_OTHER): Payer: Self-pay | Admitting: Radiology

## 2024-08-25 ENCOUNTER — Ambulatory Visit (HOSPITAL_BASED_OUTPATIENT_CLINIC_OR_DEPARTMENT_OTHER)
Admission: RE | Admit: 2024-08-25 | Discharge: 2024-08-25 | Disposition: A | Discharge status: Home / Self Care | Source: Ambulatory Visit | Attending: Family Medicine | Admitting: Family Medicine

## 2024-08-25 DIAGNOSIS — Z1231 Encounter for screening mammogram for malignant neoplasm of breast: Secondary | ICD-10-CM

## 2024-09-01 ENCOUNTER — Other Ambulatory Visit (HOSPITAL_BASED_OUTPATIENT_CLINIC_OR_DEPARTMENT_OTHER): Payer: Self-pay

## 2024-09-07 ENCOUNTER — Ambulatory Visit: Payer: Self-pay

## 2024-09-07 NOTE — Telephone Encounter (Signed)
 FYI Only or Action Required?: FYI only for provider.  Patient was last seen in primary care on 08/18/2024 by Dottie Norleen PHEBE PONCE, MD.  Called Nurse Triage reporting Depression.  Symptoms began several weeks ago.  Interventions attempted: Prescription medications: cymbalta .  Symptoms are: gradually worsening.  Triage Disposition: See Physician Within 24 Hours  Patient/caregiver understands and will follow disposition?: Yes Reason for Disposition  [1] Depression AND [2] getting worse (e.g., sleeping poorly, less able to do activities of daily living)  Answer Assessment - Initial Assessment Questions Patient reports that she is also experienced sleep walking, has caused injuries before. Last episode was 1 1/2 weeks ago. States it is induced by stress. States Cymbalta  initially but has noticed that it has been less effective recently.  1. CONCERN: What happened that made you call today?     Does not believe cybalta is being effective enough for her depression. Worsening since her husband's death.  2. DEPRESSION SYMPTOM SCREENING: How are you feeling overall? (e.g., decreased energy, increased sleeping or difficulty sleeping, difficulty concentrating, feelings of sadness, guilt, hopelessness, or worthlessness)     Sadness, worrying, irritable  3. RISK OF HARM - SUICIDAL IDEATION:  Do you ever have thoughts of hurting or killing yourself?  (e.g., yes, no, no but preoccupation with thoughts about death)     Denies  4. RISK OF HARM - HOMICIDAL IDEATION:  Do you ever have thoughts of hurting or killing someone else?  (e.g., yes, no, no but preoccupation with thoughts about death)     Denies  5. FUNCTIONAL IMPAIRMENT: How have things been going for you overall? Have you had more difficulty than usual doing your normal daily activities?  (e.g., better, same, worse; self-care, school, work, interactions)     Effecting ability to get out of bed, effecting sleep  6. SUPPORT: Who is  with you now? Who do you live with? Do you have family or friends who you can talk to?      Lives alone, her husband passed. Parents and best friends live 20 minutes away.  7. THERAPIST: Do you have a counselor or therapist? If Yes, ask: What is their name?     Goes to grief counseling  Protocols used: Depression-A-AH Copied from CRM [id number]. Topic: Clinical - Red Word Triage >> Sep 07, 2024  2:44 PM Antwanette L wrote: Red Word that prompted transfer to Nurse Triage: Mental Health. Patient reports that Duloxetine  (Cymbalta ) 30 mg capsule is not effectively managing her depression symptoms, including persistent sadness. She is requesting to discuss alternative medication options with her provider.

## 2024-09-08 ENCOUNTER — Other Ambulatory Visit (HOSPITAL_BASED_OUTPATIENT_CLINIC_OR_DEPARTMENT_OTHER): Payer: Self-pay

## 2024-09-08 ENCOUNTER — Ambulatory Visit (INDEPENDENT_AMBULATORY_CARE_PROVIDER_SITE_OTHER): Admitting: Student

## 2024-09-08 ENCOUNTER — Encounter (HOSPITAL_BASED_OUTPATIENT_CLINIC_OR_DEPARTMENT_OTHER): Payer: Self-pay | Admitting: Student

## 2024-09-08 VITALS — BP 118/75 | HR 71 | Temp 99.2°F | Resp 16 | Ht 64.96 in | Wt 284.6 lb

## 2024-09-08 DIAGNOSIS — F4321 Adjustment disorder with depressed mood: Secondary | ICD-10-CM

## 2024-09-08 DIAGNOSIS — F5101 Primary insomnia: Secondary | ICD-10-CM | POA: Diagnosis not present

## 2024-09-08 DIAGNOSIS — F324 Major depressive disorder, single episode, in partial remission: Secondary | ICD-10-CM

## 2024-09-08 MED ORDER — DULOXETINE HCL 60 MG PO CPEP
60.0000 mg | ORAL_CAPSULE | Freq: Every day | ORAL | 3 refills | Status: DC
  Filled 2024-09-08: qty 30, 30d supply, fill #0
  Filled 2024-10-05: qty 30, 30d supply, fill #1
  Filled 2024-10-24 – 2024-10-30 (×2): qty 30, 30d supply, fill #2
  Filled 2024-11-20 – 2024-11-24 (×2): qty 30, 30d supply, fill #3

## 2024-09-08 MED ORDER — QUETIAPINE FUMARATE 50 MG PO TABS
50.0000 mg | ORAL_TABLET | Freq: Every day | ORAL | 1 refills | Status: DC
  Filled 2024-09-08: qty 30, 30d supply, fill #0
  Filled 2024-10-05: qty 30, 30d supply, fill #1

## 2024-09-08 NOTE — Progress Notes (Signed)
 Acute Office Visit  Subjective:     Patient ID: Julie [name], female    DOB: 10/23/1976, 48 y.o.   MRN: [id number]  Chief Complaint  Patient presents with   Depression    Patient reports that Duloxetine  (Cymbalta ) 30 mg capsule is not effectively managing her depression symptoms, including persistent sadness. She is requesting to discuss alternative medication options with her provider. Patient reports that she is also experienced sleep walking, has caused injuries before. Last episode was 1 1/2 weeks ago. States it is induced by stress. States Cymbalta  initially but has noticed that it has been less effective recently. Has not slept in 3 days.    Anxiety    Had panic attack last night. Is in therapy. Husband had cancer. Had anniversary 2 weeks ago. Holidays are coming up. Feels like family and friends wants her to move on and she is not there yet.     HPI  Discussed the use of AI scribe software for clinical note transcription with the patient, who gave verbal consent to proceed.  History of Present Illness   Julie [name] is a 48 year old female with insomnia and depression who presents with exacerbation of symptoms related to grief- she is a patient of Dr. Briant to be seen for an acute visit.  She has been experiencing exacerbated symptoms of depression and insomnia, particularly since the anniversary of her husband's death in 03/17/2024 of this year. Her grief is described as 'coming in waves' and has been particularly intense recently.  She has been taking Cymbalta  30 mg, which initially helped, but she feels it is no longer effective due to grief exacerbation. Her symptoms have worsened, impacting her sleep and overall stress levels. She has not slept well in three days, only managing about two to two and a half hours of sleep per night, and feels unrested. She has tried melatonin and Unisom  for sleep, but these are no longer effective- she has a lengthy hx of insomnia. She  has a generally supportive family and denies SI/HI.  She experiences sleepwalking, which has been a long-term issue but has worsened since her husband's illness and death. Had a lengthy discussion about possible options.  She practices breathing exercises to manage anxiety and is aware of her triggers. She is currently attending individual and group therapy at a hospice for support.     ROS Per HPI     Objective:    BP 118/75   Pulse 71   Temp 99.2 F (37.3 C) (Oral)   Resp 16   Ht 5' 4.96 (1.65 m)   Wt 284 lb 9.6 oz (129.1 kg)   LMP  (Approximate)   SpO2 98%   BMI 47.42 kg/m  BP Readings from Last 3 Encounters:  09/08/24 118/75  08/18/24 138/87  07/20/24 112/78   Wt Readings from Last 3 Encounters:  09/08/24 284 lb 9.6 oz (129.1 kg)  08/18/24 299 lb 12.8 oz (136 kg)  07/20/24 292 lb 14.4 oz (132.9 kg)      Physical Exam Constitutional:      General: She is not in acute distress.    Appearance: Normal appearance. She is not ill-appearing.  HENT:     Head: Normocephalic.     Nose: Nose normal.  Eyes:     Conjunctiva/sclera: Conjunctivae normal.  Skin:    Coloration: Skin is not jaundiced or pale.  Neurological:     Mental Status: She is alert.  Psychiatric:  Mood and Affect: Mood is depressed.        Speech: Speech is not rapid and pressured or tangential.        Behavior: Behavior normal. Behavior is not agitated, aggressive or hyperactive.     No results found for any visits on 09/08/24.      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 Major depressive disorder exacerbated by grief reaction following the anniversary of her husband's death. Symptoms include increased stress, anxiety, and difficulty managing daily life. Initial response to Cymbalta  was positive, but effectiveness has waned. Current exacerbation likely due to life stressors rather than medication  failure. Do not believe acute benzo therapy is necessary at this time- appears stable and sleep will do her well. - Increase Cymbalta  to 60 mg daily.  - Discuss potential side effects of increased Cymbalta  dose, including nausea. - Continue individual and group therapy at hospice. - Educate on the importance of processing grief  Insomnia No signs of mania. Chronic insomnia and sleepwalking likely exacerbated by grief and stress. Current sleep aids like melatonin and Unisom  are ineffective. Sleep deprivation contributing to increased anxiety. - Prescribe Seroquel 50 mg for sleep. Instruct to take two tablets if 50 mg is ineffective, but not to exceed two tablets. - Discuss potential use of doxepin 10 mg as an alternative if Seroquel is ineffective. - Advise on the use of bed alarms or floor pads to alert during sleepwalking episodes. - Encourage completion of sleep study with Dr. Marney.      Return if symptoms worsen or fail to improve.  Nkenge Sonntag T Naphtali Riede, PA-C

## 2024-09-08 NOTE — Patient Instructions (Signed)
 It was nice to see you today!  If you have any problems before your next visit feel free to message me via MyChart (minor issues or questions) or call the office, otherwise you may reach out to schedule an office visit.  Thank you! Pau Banh, PA-C

## 2024-09-12 ENCOUNTER — Other Ambulatory Visit (HOSPITAL_BASED_OUTPATIENT_CLINIC_OR_DEPARTMENT_OTHER): Payer: Self-pay

## 2024-09-19 ENCOUNTER — Encounter: Payer: Self-pay | Admitting: Neurology

## 2024-09-19 ENCOUNTER — Institutional Professional Consult (permissible substitution): Admitting: Neurology

## 2024-10-05 ENCOUNTER — Other Ambulatory Visit (HOSPITAL_BASED_OUTPATIENT_CLINIC_OR_DEPARTMENT_OTHER): Payer: Self-pay

## 2024-10-07 ENCOUNTER — Encounter (HOSPITAL_BASED_OUTPATIENT_CLINIC_OR_DEPARTMENT_OTHER): Payer: Self-pay

## 2024-10-07 ENCOUNTER — Ambulatory Visit (HOSPITAL_BASED_OUTPATIENT_CLINIC_OR_DEPARTMENT_OTHER)
Admission: EM | Admit: 2024-10-07 | Discharge: 2024-10-07 | Disposition: A | Discharge status: Home / Self Care | Attending: Family Medicine | Admitting: Family Medicine

## 2024-10-07 DIAGNOSIS — M79662 Pain in left lower leg: Secondary | ICD-10-CM | POA: Diagnosis not present

## 2024-10-07 DIAGNOSIS — J069 Acute upper respiratory infection, unspecified: Secondary | ICD-10-CM | POA: Diagnosis not present

## 2024-10-07 DIAGNOSIS — R051 Acute cough: Secondary | ICD-10-CM

## 2024-10-07 DIAGNOSIS — R2242 Localized swelling, mass and lump, left lower limb: Secondary | ICD-10-CM

## 2024-10-07 DIAGNOSIS — R52 Pain, unspecified: Secondary | ICD-10-CM

## 2024-10-07 LAB — POC COVID19/FLU A&B COMBO
Covid Antigen, POC: NEGATIVE
Influenza A Antigen, POC: NEGATIVE
Influenza B Antigen, POC: NEGATIVE

## 2024-10-07 MED ORDER — PROMETHAZINE-DM 6.25-15 MG/5ML PO SYRP: 5.0000 mL | ORAL_SOLUTION | Freq: Four times a day (QID) | ORAL | 0 refills | Status: DC | PRN

## 2024-10-07 NOTE — Discharge Instructions (Addendum)
 Viral upper respiratory infection with bodyaches and cough: Exam shows some signs of nasal congestion but is otherwise essentially normal.  Lungs are clear/without wheezing.  Promethazine  DM, 5 mL, every 6 hours, if needed for cough.  Get plenty of fluids and rest.  Left calf pain and left lower leg swelling: Pain could be secondary to contusion or traumatic injury from fall.  However cannot exclude DVT or blood clot in the lower leg.  Encouraged if she has any concerns about a blood clot that she go to an emergency room where she can get further workup and ultrasound.  The patient reports that she intends to go to [facility] emergency room for further workup of possible blood clot in her lower leg.  Follow-up here if symptoms do not improve, if symptoms worsen or if new symptoms occur.  Contact a health care provider if: You miss a dose of your blood thinner. You have unusual bruising or other color changes. You have new or worse pain, swelling, or redness in an arm or a leg. You have worsening numbness or tingling in an arm or a leg. You have a significant color change (pale or blue) in the extremity that has the DVT. Get help right away if: You have signs or symptoms that a blood clot has moved to the lungs. These may include: Shortness of breath. Chest pain. Fast or irregular heartbeats (palpitations). Light-headedness, dizziness, or fainting. Coughing up blood. You have signs or symptoms that your blood is too thin. These may include: Blood in your vomit, stool, or urine. A cut that will not stop bleeding. A menstrual period that is heavier than usual. A severe headache or confusion. These symptoms may be an emergency. Get help right away. Call 911. Do not wait to see if the symptoms will go away. Do not drive yourself to the hospital.

## 2024-10-07 NOTE — ED Triage Notes (Signed)
 Took a COVID test at home but it was negative last night  Started to feel bad yesterday  Headache, joint pain, hurts to walk, cough, chills   Leg swelling - left leg Sleeps walks explaining the leg bruises

## 2024-10-07 NOTE — ED Provider Notes (Signed)
 [facility]    CSN: [id number] Arrival date & time: 10/07/24  9176      History   Chief Complaint Chief Complaint  Patient presents with   Nasal Congestion   Headache   Leg Swelling   Cough    HPI Julie [name] is a 48 y.o. female.   48 year old female who reports runny nose, headache, cough, chills, body aches including left hip pain.  Symptoms started during the night of 10/05/2024.  She took a COVID test at home on 10/06/2024 and it was negative.  She feels miserable and is hurting all over.  The headache is a frontal headache and she reports every time she has had COVID she has had the same kind of a headache.  Patient reports that she sleepwalks and often times has no idea what happens while she is sleepwalking.  On the night of 09/30/2024, she believes she was sleepwalking and fell.  On the morning of 10/01/2024, she woke up with bruising on her left lateral thigh and left calf and soreness.  The bruising is fading but she has more soreness in her left calf and she thinks the left lower leg looks swollen.  She has never had a blood clot before.  She denies shortness of breath nor chest pain.  She is morbidly obese and she is a smoker.  She is not aware of any clotting disorders in her family.   Headache Associated symptoms: cough   Associated symptoms: no abdominal pain, no back pain, no diarrhea, no ear pain, no eye pain, no fever, no nausea, no seizures, no sore throat and no vomiting   Cough Associated symptoms: chills and headaches   Associated symptoms: no chest pain, no ear pain, no fever, no rash, no shortness of breath and no sore throat     Past Medical History:  Diagnosis Date   GERD (gastroesophageal reflux disease)    History of stomach ulcers    distant past   Hypertension    Hypothyroidism    IBS (irritable bowel syndrome)    Insomnia    Morbid obesity (HCC)    Osteoarthritis    Tobacco abuse    Unable to quit    Patient Active  Problem List   Diagnosis Date Noted   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   Dyslipidemia 08/18/2024   Depression, major, single episode, 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   OSA (obstructive sleep apnea) 07/20/2024   History of stomach ulcers 07/20/2024   Tobacco abuse 07/20/2024   Cervical radicular pain 10/20/2023   Brachial neuritis 07/23/2022   Candidal stomatitis 07/23/2022   Acquired hypothyroidism 12/02/2021   Status post cervical spinal fusion 11/08/2020   Chronic lumbar radiculopathy 02/01/2020   Cervical post-laminectomy syndrome 09/27/2017   Pain syndrome, chronic 09/27/2017   Pseudoarthrosis of cervical spine (HCC) 10/18/2015   Spinal stenosis of cervical region 11/07/2014   GAD (generalized anxiety disorder) 07/06/2014   Left shoulder pain 11/11/2012   Mid back pain 12/22/2010   Neck pain 12/22/2010   Right-sided chest wall pain 12/22/2010   DISC DISEASE, CERVICAL 07/10/2010   INSOMNIA 04/10/2010   DIARRHEA 02/28/2010   ABDOMINAL PAIN, GENERALIZED 02/28/2010   IRRITABLE BOWEL SYNDROME 08/23/2009   Carbuncle and furuncle 02/15/2009   Furunculosis 02/15/2009   OBESITY, MORBID 02/05/2009   Essential hypertension 02/05/2009   GERD 02/05/2009   Peptic ulcer 02/05/2009    Past Surgical History:  Procedure Laterality Date   ANTERIOR CERVICAL DECOMP/DISCECTOMY FUSION N/A 11/07/2014   Procedure:  ANTERIOR CERVICAL DECOMPRESSION/DISCECTOMY FUSION THREE-FOUR ,FOUR-FIVE,SIX SEVEN LONEY REMOVAL FIVE-SIX;  Surgeon: Arley SHAUNNA Helling, MD;  Location: [facility];  Service: Neurosurgery;  Laterality: N/A;   CARPAL TUNNEL RELEASE Left    CERVICAL SPINE SURGERY  11/06/2014   x2   COLONOSCOPY N/A    Scheduled 10/25   MOUTH SURGERY  12/07/2008   gum work   POSTERIOR CERVICAL FUSION/FORAMINOTOMY N/A 10/18/2015   Procedure: Cervical three-four-Cervical four-five Cervical five-six Cervical six-seven Posterior Cervical Fusion with lateral mass fixation ;  Surgeon: Arley Helling, MD;  Location: [facility] OR[facility];  Service: Neurosurgery;  Laterality: N/A;   TONSILLECTOMY  12/07/2004    OB History   No obstetric history on file.      Home Medications    Prior to Admission medications   Medication Sig Start Date End Date Taking? Authorizing Provider  promethazine -dextromethorphan (PROMETHAZINE -DM) 6.25-15 MG/5ML syrup Take 5 mLs by mouth 4 (four) times daily as needed for cough. Do not use and drive - May make drowsy. 10/07/24  Yes Ival Domino, FNP  carvedilol  (COREG ) 12.5 MG tablet TAKE 1 TABLET BY MOUTH TWICE A DAY WITH A MEAL 06/21/15   Jame Maude FALCON, MD  Diclofenac  Potassium (CAMBIA ) 50 MG PACK Take 50 mg by mouth daily as needed (migraines). Mix in 4 oz water and drink    [provider]  diphenoxylate -atropine  (LOMOTIL ) 2.5-0.025 MG tablet Take 1 tablet by mouth 4 (four) times daily as needed for diarrhea. 06/16/24     doxylamine , Sleep, (UNISOM ) 25 MG tablet Take 25 mg by mouth at bedtime as needed for sleep.     [provider]  DULoxetine  (CYMBALTA ) 60 MG capsule Take 1 capsule (60 mg total) by mouth daily. 09/08/24   Rothfuss, Jacob T, PA-C  gabapentin  (NEURONTIN ) 600 MG tablet Take 1 tablet (600 mg total) by mouth 3 (three) times daily. Patient taking differently: Take 1,800 mg by mouth daily. 02/04/24     levalbuterol  (XOPENEX  HFA) 45 MCG/ACT inhaler Inhale 2 puffs into the lungs every 6 (six) hours as needed for up to 10 days for wheezing or shortness of breath. 10/24/23 09/08/24  Acevedo, Angela, PA  levothyroxine  (SYNTHROID ) 137 MCG tablet Take 1 tablet (137 mcg total) by mouth in the morning. 07/20/24   Dottie Norleen PHEBE PONCE, MD  oxyCODONE -acetaminophen  (PERCOCET) 7.5-325 MG tablet Take 1 tablet by mouth every 6 (six) hours. 12/20/23     pravastatin  (PRAVACHOL ) 20 MG tablet Take 1 tablet (20 mg total) by mouth daily. Patient not taking: Reported on 09/08/2024 08/22/24   Dottie Norleen PHEBE PONCE, MD  QUEtiapine (SEROQUEL) 50 MG tablet Take 1 tablet (50 mg total) by  mouth at bedtime. You may increase up to 2 tablets (100 mg total) at bedtime. 09/08/24   Rothfuss, Jacob T, PA-C  tiZANidine  (ZANAFLEX ) 4 MG tablet Take 1 tablet (4 mg total) by mouth every 8 (eight) hours as needed. 03/09/24     traMADol (ULTRAM) 50 MG tablet Take 50 mg by mouth. 08/29/24 11/27/24  [provider]    Family History Family History  Problem Relation Age of Onset   Asthma Mother    Hypertension Mother    Hypothyroidism Mother    Hypertension Father    Restless legs syndrome Father    Sleep apnea Father    Colon polyps Father     Social History Social History   Tobacco Use   Smoking status: Every Day    Current packs/day: 1.00    Average packs/day:  1 pack/day for 18.8 years (18.8 ttl pk-yrs)    Types: Cigarettes    Start date: 2007    Passive exposure: Never   Smokeless tobacco: Never  Vaping Use   Vaping status: Every Day  Substance Use Topics   Alcohol use: No   Drug use: No     Allergies   Clarithromycin, Dicyclomine hcl, Nsaids, Sulfonamide derivatives, Lisinopril, and Prednisone   Review of Systems Review of Systems  Constitutional:  Positive for chills. Negative for fever.  HENT:  Negative for ear pain and sore throat.   Eyes:  Negative for pain and visual disturbance.  Respiratory:  Positive for cough. Negative for shortness of breath.   Cardiovascular:  Positive for leg swelling (Left calf). Negative for chest pain and palpitations.  Gastrointestinal:  Negative for abdominal pain, constipation, diarrhea, nausea and vomiting.  Genitourinary:  Negative for dysuria and hematuria.  Musculoskeletal:  Positive for arthralgias. Negative for back pain.  Skin:  Negative for color change and rash.  Neurological:  Positive for headaches. Negative for seizures and syncope.  All other systems reviewed and are negative.    Physical Exam Triage Vital Signs ED Triage Vitals [10/07/24 0843]  Encounter Vitals Group     BP      Girls Systolic BP  Percentile      Girls Diastolic BP Percentile      Boys Systolic BP Percentile      Boys Diastolic BP Percentile      Pulse      Resp      Temp      Temp src      SpO2      Weight      Height      Head Circumference      Peak Flow      Pain Score 7     Pain Loc      Pain Education      Exclude from Growth Chart    No data found.  Updated Vital Signs BP 104/73 (BP Location: Right Arm)   Pulse 72   Temp 97.9 F (36.6 C) (Oral)   Resp 18   SpO2 94%   Visual Acuity Right Eye Distance:   Left Eye Distance:   Bilateral Distance:    Right Eye Near:   Left Eye Near:    Bilateral Near:     Physical Exam Vitals and nursing note reviewed.  Constitutional:      General: She is not in acute distress.    Appearance: She is well-developed. She is ill-appearing. She is not toxic-appearing or diaphoretic.  HENT:     Head: Normocephalic and atraumatic.     Right Ear: Hearing, tympanic membrane, ear canal and external ear normal.     Left Ear: Hearing, tympanic membrane, ear canal and external ear normal.     Nose: Congestion and rhinorrhea present. Rhinorrhea is clear.     Right Sinus: Maxillary sinus tenderness (Pressure in the sinus but no pain) and frontal sinus tenderness (Pressure in the sinus but no pain) present.     Left Sinus: Maxillary sinus tenderness (Pressure in the sinus but no pain) and frontal sinus tenderness (Pressure in the sinus but no pain) present.     Mouth/Throat:     Lips: Pink.     Mouth: Mucous membranes are moist.     Pharynx: Uvula midline. No oropharyngeal exudate or posterior oropharyngeal erythema.     Tonsils: No tonsillar exudate.  Eyes:  Conjunctiva/sclera: Conjunctivae normal.     Pupils: Pupils are equal, round, and reactive to light.  Cardiovascular:     Rate and Rhythm: Normal rate and regular rhythm.     Heart sounds: S1 normal and S2 normal. No murmur heard. Pulmonary:     Effort: Pulmonary effort is normal. No respiratory  distress.     Breath sounds: Normal breath sounds. No decreased breath sounds, wheezing, rhonchi or rales.  Abdominal:     General: Bowel sounds are normal.     Palpations: Abdomen is soft.     Tenderness: There is no abdominal tenderness.  Musculoskeletal:        General: No swelling.     Cervical back: Neck supple.     Right hip: Normal.     Left hip: No deformity, lacerations, tenderness (No tenderness on exam but she has pain with movement of the left hip.), bony tenderness or crepitus. Normal range of motion. Normal strength.     Right upper leg: Normal.     Right knee: Normal.     Left knee: Normal.     Right lower leg: Swelling present. No deformity, lacerations, tenderness or bony tenderness. No edema.     Left lower leg: Swelling and tenderness (No tenderness with palpation but significant pain or tenderness with dorsiflexion of the foot (positive Homans' sign).) present. No deformity, lacerations or bony tenderness. No edema.     Right ankle: Normal.     Left ankle: Normal.     Right foot: Normal.     Left foot: Normal.     Comments: Left thigh with some ecchymosis on the lateral thigh.  There is an area that is 6 inches in radius and is a very light purple-blue with a yellow tent.  Leg measurements: Right leg immediately below the knee: 48 cm, mid calf: 50 cm, immediately above the ankle: 23 cm.  Left leg immediately below the knee: 52 cm, mid calf: 52 cm, immediately above the ankle: 24 cm.  Lymphadenopathy:     Head:     Right side of head: No submental, submandibular, tonsillar, preauricular or posterior auricular adenopathy.     Left side of head: No submental, submandibular, tonsillar, preauricular or posterior auricular adenopathy.     Cervical: No cervical adenopathy.     Right cervical: No superficial cervical adenopathy.    Left cervical: No superficial cervical adenopathy.  Skin:    General: Skin is warm and dry.     Capillary Refill: Capillary refill takes less  than 2 seconds.     Findings: No rash.  Neurological:     Mental Status: She is alert and oriented to person, place, and time.  Psychiatric:        Mood and Affect: Mood normal.      UC Treatments / Results  Labs (all labs ordered are listed, but only abnormal results are displayed) Labs Reviewed  POC COVID19/FLU A&B COMBO - Normal    EKG   Radiology No results found.  Procedures Procedures (including critical care time)  Medications Ordered in UC Medications - No data to display  Initial Impression / Assessment and Plan / UC Course  I have reviewed the triage vital signs and the nursing notes.  Pertinent labs & imaging results that were available during my care of the patient were reviewed by me and considered in my medical decision making (see chart for details).  Plan of Care: Viral upper respiratory infection with bodyaches and cough: Exam  shows some signs of nasal congestion but is otherwise essentially normal.  Lungs are clear/without wheezing.  Promethazine  DM, 5 mL, every 6 hours, if needed for cough.  Get plenty of fluids and rest.  Left calf pain and left lower leg swelling: Pain could be secondary to contusion or traumatic injury from fall.  However cannot exclude DVT or blood clot in the lower leg.  Encouraged if she has any concerns about a blood clot that she go to an emergency room where she can get further workup and ultrasound.  The patient reports that she intends to go to [facility] emergency room for further workup of possible blood clot in her lower leg.  Follow-up here if symptoms do not improve, if symptoms worsen or if new symptoms occur.  I reviewed the plan of care with the patient and/or the patient's guardian.  The patient and/or guardian had time to ask questions and acknowledged that the questions were answered.  I provided instruction on symptoms or reasons to return here or to go to an ER, if symptoms/condition did not improve,  worsened or if new symptoms occurred.  Final Clinical Impressions(s) / UC Diagnoses   Final diagnoses:  Acute cough  Generalized body aches  Viral URI with cough  Pain of left calf  Localized swelling of left lower leg     Discharge Instructions      Viral upper respiratory infection with bodyaches and cough: Exam shows some signs of nasal congestion but is otherwise essentially normal.  Lungs are clear/without wheezing.  Promethazine  DM, 5 mL, every 6 hours, if needed for cough.  Get plenty of fluids and rest.  Left calf pain and left lower leg swelling: Pain could be secondary to contusion or traumatic injury from fall.  However cannot exclude DVT or blood clot in the lower leg.  Encouraged if she has any concerns about a blood clot that she go to an emergency room where she can get further workup and ultrasound.  The patient reports that she intends to go to [facility] emergency room for further workup of possible blood clot in her lower leg.  Follow-up here if symptoms do not improve, if symptoms worsen or if new symptoms occur.  Contact a health care provider if: You miss a dose of your blood thinner. You have unusual bruising or other color changes. You have new or worse pain, swelling, or redness in an arm or a leg. You have worsening numbness or tingling in an arm or a leg. You have a significant color change (pale or blue) in the extremity that has the DVT. Get help right away if: You have signs or symptoms that a blood clot has moved to the lungs. These may include: Shortness of breath. Chest pain. Fast or irregular heartbeats (palpitations). Light-headedness, dizziness, or fainting. Coughing up blood. You have signs or symptoms that your blood is too thin. These may include: Blood in your vomit, stool, or urine. A cut that will not stop bleeding. A menstrual period that is heavier than usual. A severe headache or confusion. These symptoms may be an  emergency. Get help right away. Call 911. Do not wait to see if the symptoms will go away. Do not drive yourself to the hospital.     ED Prescriptions     Medication Sig Dispense Auth. Provider   promethazine -dextromethorphan (PROMETHAZINE -DM) 6.25-15 MG/5ML syrup Take 5 mLs by mouth 4 (four) times daily as needed for cough. Do not use and drive -  May make drowsy. 118 mL Ival Domino, FNP      PDMP not reviewed this encounter.   Ival Domino, FNP 10/07/24 [phone]

## 2024-10-09 ENCOUNTER — Ambulatory Visit (HOSPITAL_BASED_OUTPATIENT_CLINIC_OR_DEPARTMENT_OTHER): Payer: Self-pay | Admitting: Family Medicine

## 2024-10-09 ENCOUNTER — Other Ambulatory Visit (HOSPITAL_BASED_OUTPATIENT_CLINIC_OR_DEPARTMENT_OTHER): Payer: Self-pay

## 2024-10-09 ENCOUNTER — Ambulatory Visit (INDEPENDENT_AMBULATORY_CARE_PROVIDER_SITE_OTHER): Admit: 2024-10-09 | Discharge: 2024-10-09 | Disposition: A | Discharge status: Home / Self Care | Admitting: Radiology

## 2024-10-09 ENCOUNTER — Encounter (HOSPITAL_BASED_OUTPATIENT_CLINIC_OR_DEPARTMENT_OTHER): Payer: Self-pay

## 2024-10-09 ENCOUNTER — Ambulatory Visit (HOSPITAL_BASED_OUTPATIENT_CLINIC_OR_DEPARTMENT_OTHER)
Admission: EM | Admit: 2024-10-09 | Discharge: 2024-10-09 | Disposition: A | Discharge status: Home / Self Care | Attending: Family Medicine | Admitting: Family Medicine

## 2024-10-09 DIAGNOSIS — J3489 Other specified disorders of nose and nasal sinuses: Secondary | ICD-10-CM | POA: Diagnosis not present

## 2024-10-09 DIAGNOSIS — J069 Acute upper respiratory infection, unspecified: Secondary | ICD-10-CM

## 2024-10-09 DIAGNOSIS — M25552 Pain in left hip: Secondary | ICD-10-CM | POA: Diagnosis not present

## 2024-10-09 DIAGNOSIS — W19XXXA Unspecified fall, initial encounter: Secondary | ICD-10-CM | POA: Diagnosis not present

## 2024-10-09 MED ORDER — AMOXICILLIN 875 MG PO TABS
875.0000 mg | ORAL_TABLET | Freq: Two times a day (BID) | ORAL | 0 refills | Status: AC
  Filled 2024-10-09: qty 14, 7d supply, fill #0

## 2024-10-09 NOTE — Discharge Instructions (Signed)
 I have not gotten your xray results yet. I will send to your mychart when I do. I am treating you for sinus and respiratory infection with Amoxicillin. Recommend Mucinex OTC for congestion and cough.  You can take OTC pain meds as needed. Ice the areas.

## 2024-10-09 NOTE — ED Triage Notes (Addendum)
 Pt was seen 2 days ago is not any better, states she sleeps walks and she fell on 11/1 in the middle of the night and she struck her nose.No discoloration/swelling/deformity to nose.  Concerned it may be fractured, no bruising. Pt state her right hip hurts, reports its like sitting on a orane the pain is like a broke pain pt ambulated into exam room on her own no limp visible.

## 2024-10-10 NOTE — ED Provider Notes (Signed)
 [facility]    CSN: [id number] Arrival date & time: 10/09/24  1211      History   Chief Complaint Chief Complaint  Patient presents with   sinus congestion   Joint Pain   Facial Pain    HPI Julie [name] is a 48 y.o. female.   Patient is a 47 year old female who presents today with worsening cough and congestion and a fall.  Pt was seen 2 days ago is not any better. States she also sleeps walks and she fell on 11/1 in the middle of the night and she struck her nose.No discoloration/swelling/deformity to nose.  Concerned it may be fractured, no bruising.She is also having right hip pain from the fall. Able to ambulate without difficulty.      Past Medical History:  Diagnosis Date   GERD (gastroesophageal reflux disease)    History of stomach ulcers    distant past   Hypertension    Hypothyroidism    IBS (irritable bowel syndrome)    Insomnia    Morbid obesity (HCC)    Osteoarthritis    Tobacco abuse    Unable to quit    Patient Active Problem List   Diagnosis Date Noted   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   Dyslipidemia 08/18/2024   Depression, major, single episode, 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   OSA (obstructive sleep apnea) 07/20/2024   History of stomach ulcers 07/20/2024   Tobacco abuse 07/20/2024   Cervical radicular pain 10/20/2023   Brachial neuritis 07/23/2022   Candidal stomatitis 07/23/2022   Acquired hypothyroidism 12/02/2021   Status post cervical spinal fusion 11/08/2020   Chronic lumbar radiculopathy 02/01/2020   Cervical post-laminectomy syndrome 09/27/2017   Pain syndrome, chronic 09/27/2017   Pseudoarthrosis of cervical spine (HCC) 10/18/2015   Spinal stenosis of cervical region 11/07/2014   GAD (generalized anxiety disorder) 07/06/2014   Left shoulder pain 11/11/2012   Mid back pain 12/22/2010   Neck pain 12/22/2010   Right-sided chest wall pain 12/22/2010   DISC DISEASE, CERVICAL 07/10/2010   INSOMNIA 04/10/2010    DIARRHEA 02/28/2010   ABDOMINAL PAIN, GENERALIZED 02/28/2010   IRRITABLE BOWEL SYNDROME 08/23/2009   Carbuncle and furuncle 02/15/2009   Furunculosis 02/15/2009   OBESITY, MORBID 02/05/2009   Essential hypertension 02/05/2009   GERD 02/05/2009   Peptic ulcer 02/05/2009    Past Surgical History:  Procedure Laterality Date   ANTERIOR CERVICAL DECOMP/DISCECTOMY FUSION N/A 11/07/2014   Procedure: ANTERIOR CERVICAL DECOMPRESSION/DISCECTOMY FUSION THREE-FOUR ,FOUR-FIVE,SIX SEVEN LONEY REMOVAL FIVE-SIX;  Surgeon: Arley SHAUNNA Helling, MD;  Location: [facility] OR;  Service: Neurosurgery;  Laterality: N/A;   CARPAL TUNNEL RELEASE Left    CERVICAL SPINE SURGERY  11/06/2014   x2   COLONOSCOPY N/A    Scheduled 10/25   MOUTH SURGERY  12/07/2008   gum work   POSTERIOR CERVICAL FUSION/FORAMINOTOMY N/A 10/18/2015   Procedure: Cervical three-four-Cervical four-five Cervical five-six Cervical six-seven Posterior Cervical Fusion with lateral mass fixation ;  Surgeon: Arley Helling, MD;  Location: [facility];  Service: Neurosurgery;  Laterality: N/A;   TONSILLECTOMY  12/07/2004    OB History   No obstetric history on file.      Home Medications    Prior to Admission medications   Medication Sig Start Date End Date Taking? Authorizing Provider  amoxicillin (AMOXIL) 875 MG tablet Take 1 tablet (875 mg total) by mouth 2 (two) times daily for 7 days. 10/09/24 10/16/24 Yes Cottrell Gentles A, FNP  carvedilol  (COREG ) 12.5 MG tablet TAKE  1 TABLET BY MOUTH TWICE A DAY WITH A MEAL 06/21/15   Jame Maude FALCON, MD  Diclofenac  Potassium (CAMBIA ) 50 MG PACK Take 50 mg by mouth daily as needed (migraines). Mix in 4 oz water and drink    [provider]  diphenoxylate -atropine  (LOMOTIL ) 2.5-0.025 MG tablet Take 1 tablet by mouth 4 (four) times daily as needed for diarrhea. 06/16/24     doxylamine , Sleep, (UNISOM ) 25 MG tablet Take 25 mg by mouth at bedtime as needed for sleep.     [provider]  DULoxetine   (CYMBALTA ) 60 MG capsule Take 1 capsule (60 mg total) by mouth daily. 09/08/24   Rothfuss, Jacob T, PA-C  gabapentin  (NEURONTIN ) 600 MG tablet Take 1 tablet (600 mg total) by mouth 3 (three) times daily. Patient taking differently: Take 1,800 mg by mouth daily. 02/04/24     levalbuterol  (XOPENEX  HFA) 45 MCG/ACT inhaler Inhale 2 puffs into the lungs every 6 (six) hours as needed for up to 10 days for wheezing or shortness of breath. 10/24/23 09/08/24  Acevedo, Angela, PA  levothyroxine  (SYNTHROID ) 137 MCG tablet Take 1 tablet (137 mcg total) by mouth in the morning. 07/20/24   Dottie Norleen PHEBE PONCE, MD  oxyCODONE -acetaminophen  (PERCOCET) 7.5-325 MG tablet Take 1 tablet by mouth every 6 (six) hours. 12/20/23     pravastatin  (PRAVACHOL ) 20 MG tablet Take 1 tablet (20 mg total) by mouth daily. Patient not taking: Reported on 09/08/2024 08/22/24   Dottie Norleen PHEBE PONCE, MD  promethazine -dextromethorphan (PROMETHAZINE -DM) 6.25-15 MG/5ML syrup Take 5 mLs by mouth 4 (four) times daily as needed for cough. Do not use and drive - May make drowsy. 10/07/24   Ival Domino, FNP  QUEtiapine (SEROQUEL) 50 MG tablet Take 1 tablet (50 mg total) by mouth at bedtime. You may increase up to 2 tablets (100 mg total) at bedtime. 09/08/24   Rothfuss, Jacob T, PA-C  tiZANidine  (ZANAFLEX ) 4 MG tablet Take 1 tablet (4 mg total) by mouth every 8 (eight) hours as needed. 03/09/24     traMADol (ULTRAM) 50 MG tablet Take 50 mg by mouth. 08/29/24 11/27/24  [provider]    Family History Family History  Problem Relation Age of Onset   Asthma Mother    Hypertension Mother    Hypothyroidism Mother    Hypertension Father    Restless legs syndrome Father    Sleep apnea Father    Colon polyps Father     Social History Social History   Tobacco Use   Smoking status: Every Day    Current packs/day: 1.00    Average packs/day: 1 pack/day for 18.8 years (18.8 ttl pk-yrs)    Types: Cigarettes    Start date: 2007    Passive  exposure: Never   Smokeless tobacco: Never  Vaping Use   Vaping status: Every Day  Substance Use Topics   Alcohol use: No   Drug use: No     Allergies   Clarithromycin, Dicyclomine hcl, Nsaids, Sulfonamide derivatives, Lisinopril, and Prednisone   Review of Systems Review of Systems See HPI  Physical Exam Triage Vital Signs ED Triage Vitals  Encounter Vitals Group     BP 10/09/24 1238 (!) 113/56     Girls Systolic BP Percentile --      Girls Diastolic BP Percentile --      Boys Systolic BP Percentile --      Boys Diastolic BP Percentile --      Pulse Rate 10/09/24 1238 76  Resp 10/09/24 1238 18     Temp 10/09/24 1238 98.1 F (36.7 C)     Temp Source 10/09/24 1238 Oral     SpO2 10/09/24 1238 96 %     Weight --      Height --      Head Circumference --      Peak Flow --      Pain Score 10/09/24 1239 8     Pain Loc --      Pain Education --      Exclude from Growth Chart --    No data found.  Updated Vital Signs BP (!) 113/56 (BP Location: Right Arm)   Pulse 76   Temp 98.1 F (36.7 C) (Oral)   Resp 18   SpO2 96%   Visual Acuity Right Eye Distance:   Left Eye Distance:   Bilateral Distance:    Right Eye Near:   Left Eye Near:    Bilateral Near:     Physical Exam Vitals and nursing note reviewed.  Constitutional:      General: She is not in acute distress.    Appearance: Normal appearance. She is not ill-appearing, toxic-appearing or diaphoretic.  HENT:     Nose:     Comments: Mild swelling to nasal bridge. No obvious deformity.     Mouth/Throat:     Pharynx: Oropharynx is clear.  Eyes:     Conjunctiva/sclera: Conjunctivae normal.  Cardiovascular:     Rate and Rhythm: Normal rate and regular rhythm.  Pulmonary:     Effort: Pulmonary effort is normal.     Breath sounds: Wheezing and rhonchi present.  Musculoskeletal:        General: Tenderness present.     Right hip: Tenderness present. Decreased range of motion.  Neurological:     Mental  Status: She is alert.  Psychiatric:        Mood and Affect: Mood normal.      UC Treatments / Results  Labs (all labs ordered are listed, but only abnormal results are displayed) Labs Reviewed - No data to display  EKG   Radiology DG Hip Unilat With Pelvis 2-3 Views Right Result Date: 10/09/2024 EXAM: 2 or more VIEW(S) XRAY OF THE HIP 10/09/2024 01:33:00 PM COMPARISON: None available. CLINICAL HISTORY: Hip pain FINDINGS: BONES AND JOINTS: No acute fracture or focal osseous lesion. The hip joint is maintained. No significant degenerative changes. SOFT TISSUES: The soft tissues are unremarkable. IMPRESSION: 1. No acute osseous abnormality of the hip identified. Electronically signed by: Waddell Calk MD 10/09/2024 02:51 PM EST RP Workstation: [id number]   DG Nasal Bones Result Date: 10/09/2024 CLINICAL DATA:  Clemens, struck nose EXAM: NASAL BONES - 3+ VIEW COMPARISON:  None Available. FINDINGS: Waters and bilateral lateral views of the nasal bones are obtained. Metallic object within the left teres consistent with jewelry. There are no acute displaced fractures. Nasal septum deviates to the right. Nasal passage appears clear. Paranasal sinuses are unremarkable. Soft tissues are normal. IMPRESSION: 1. No acute nasal bone fracture. Electronically Signed   By: Ozell Daring M.D.   On: 10/09/2024 14:48    Procedures Procedures (including critical care time)  Medications Ordered in UC Medications - No data to display  Initial Impression / Assessment and Plan / UC Course  I have reviewed the triage vital signs and the nursing notes.  Pertinent labs & imaging results that were available during my care of the patient were reviewed by me and considered in my  medical decision making (see chart for details).     Fall with right hip pain and nasal pain-no concerns on x-rays for any fractures.  Most likely just bruised.  Recommend icing the area and she can take over-the-counter pain relievers as  needed.  Follow-up as needed  Upper respiratory tract infection-worsening cough and congestion.  Wheezing and rhonchi on exam.  Recommend Mucinex over-the-counter for congestion and cough.  Will prescribe amoxicillin at this time.  She can use the cough medication that was previously prescribed at last visit for cough as needed  Final Clinical Impressions(s) / UC Diagnoses   Final diagnoses:  Fall, initial encounter  Upper respiratory tract infection, unspecified type     Discharge Instructions      I have not gotten your xray results yet. I will send to your mychart when I do. I am treating you for sinus and respiratory infection with Amoxicillin. Recommend Mucinex OTC for congestion and cough.  You can take OTC pain meds as needed. Ice the areas.    ED Prescriptions     Medication Sig Dispense Auth. Provider   amoxicillin (AMOXIL) 875 MG tablet Take 1 tablet (875 mg total) by mouth 2 (two) times daily for 7 days. 14 tablet Adah Wilbert LABOR, FNP      PDMP not reviewed this encounter.   Adah Wilbert LABOR, FNP 10/10/24 [phone]

## 2024-10-16 ENCOUNTER — Telehealth: Payer: Self-pay

## 2024-10-16 NOTE — Transitions of Care (Post Inpatient/ED Visit) (Signed)
 10/16/2024  Name: Julie [name] MRN: [id number] DOB: 31-Jan-1976  Today's TOC FU Call Status: Today's TOC FU Call Status:: Successful TOC FU Call Completed TOC FU Call Complete Date: 10/16/24 Patient's Name and Date of Birth confirmed.  Transition Care Management Follow-up Telephone Call Date of Discharge: 10/15/24 Discharge Facility: Other [facility]) Name of Other ([facility]) Discharge Facility: [facility] Type of Discharge: Inpatient Admission Primary Inpatient Discharge Diagnosis:: fall How have you been since you were released from the hospital?: Better Any questions or concerns?: No  Items Reviewed: Did you receive and understand the discharge instructions provided?: Yes Medications obtained,verified, and reconciled?: Yes (Medications Reviewed) Any new allergies since your discharge?: No Dietary orders reviewed?: Yes Do you have support at home?: Yes People in Home [RPT]: parent(s)  Medications Reviewed Today: Medications Reviewed Today     Reviewed by Emmitt Pan, LPN (Licensed Practical Nurse) on 10/16/24 at 1031  Med List Status: <None>   Medication Order Taking? Sig Documenting Provider Last Dose Status Informant  amoxicillin (AMOXIL) 875 MG tablet [id number] Yes Take 1 tablet (875 mg total) by mouth 2 (two) times daily for 7 days. Adah Corning A, FNP  Active   carvedilol  (COREG ) 12.5 MG tablet [id number] Yes TAKE 1 TABLET BY MOUTH TWICE A DAY WITH A MEAL Jame Maude FALCON, MD  Active Self  Diclofenac  Potassium (CAMBIA ) 50 MG PACK [id number] Yes Take 50 mg by mouth daily as needed (migraines). Mix in 4 oz water and drink [provider]  Active Self  diphenoxylate -atropine  (LOMOTIL ) 2.5-0.025 MG tablet [id number] Yes Take 1 tablet by mouth 4 (four) times daily as needed for diarrhea.   Active   doxylamine , Sleep, (UNISOM ) 25 MG tablet [id number] Yes Take 25 mg by mouth at bedtime as needed for sleep.  [provider]  Active Self   DULoxetine  (CYMBALTA ) 60 MG capsule [id number] Yes Take 1 capsule (60 mg total) by mouth daily. Rothfuss, Jacob T, PA-C  Active   gabapentin  (NEURONTIN ) 600 MG tablet [id number] Yes Take 1 tablet (600 mg total) by mouth 3 (three) times daily.  Patient taking differently: Take 1,800 mg by mouth daily.     Active   levalbuterol  (XOPENEX  HFA) 45 MCG/ACT inhaler [id number] Yes Inhale 2 puffs into the lungs every 6 (six) hours as needed for up to 10 days for wheezing or shortness of breath. Acevedo, Angela, PA  Active   levothyroxine  (SYNTHROID ) 137 MCG tablet [id number] Yes Take 1 tablet (137 mcg total) by mouth in the morning. Dottie Norleen PHEBE PONCE, MD  Active   oxyCODONE -acetaminophen  (PERCOCET) 7.5-325 MG tablet [id number] Yes Take 1 tablet by mouth every 6 (six) hours.   Active   pravastatin  (PRAVACHOL ) 20 MG tablet [id number]  Take 1 tablet (20 mg total) by mouth daily.  Patient not taking: Reported on 10/16/2024   Dottie Norleen PHEBE PONCE, MD  Active   promethazine -dextromethorphan (PROMETHAZINE -DM) 6.25-15 MG/5ML syrup [id number] Yes Take 5 mLs by mouth 4 (four) times daily as needed for cough. Do not use and drive - May make drowsy. Ival Domino, FNP  Active   QUEtiapine (SEROQUEL) 50 MG tablet [id number] Yes Take 1 tablet (50 mg total) by mouth at bedtime. You may increase up to 2 tablets (100 mg total) at bedtime. Rothfuss, Jacob T, PA-C  Active   tiZANidine  (ZANAFLEX ) 4 MG tablet [id number] Yes Take 1 tablet (4 mg total) by mouth every 8 (eight) hours as needed.   Active   traMADol (ULTRAM) 50 MG tablet [id number] Yes  Take 50 mg by mouth. [provider]  Active             Home Care and Equipment/Supplies: Were Home Health Services Ordered?: NA Any new equipment or medical supplies ordered?: NA  Functional Questionnaire: Do you need assistance with bathing/showering or dressing?: No Do you need assistance with meal preparation?: No Do you need assistance with eating?: No Do you have  difficulty maintaining continence: No Do you need assistance with getting out of bed/getting out of a chair/moving?: No Do you have difficulty managing or taking your medications?: No  Follow up appointments reviewed: PCP Follow-up appointment confirmed?: Yes Date of PCP follow-up appointment?: 10/23/24 Follow-up Provider: [facility] Follow-up appointment confirmed?: NA Do you need transportation to your follow-up appointment?: No Do you understand care options if your condition(s) worsen?: Yes-patient verbalized understanding    SIGNATURE Julian Lemmings, LPN [facility] Nurse Health Advisor Direct Dial [phone]

## 2024-10-19 ENCOUNTER — Telehealth (HOSPITAL_BASED_OUTPATIENT_CLINIC_OR_DEPARTMENT_OTHER): Payer: Self-pay

## 2024-10-19 NOTE — Telephone Encounter (Signed)
 Called and left message for Julie [name] to call office back

## 2024-10-23 ENCOUNTER — Inpatient Hospital Stay (HOSPITAL_BASED_OUTPATIENT_CLINIC_OR_DEPARTMENT_OTHER): Admitting: Family Medicine

## 2024-10-23 ENCOUNTER — Telehealth (HOSPITAL_BASED_OUTPATIENT_CLINIC_OR_DEPARTMENT_OTHER): Payer: Self-pay | Admitting: *Deleted

## 2024-10-23 NOTE — Telephone Encounter (Signed)
 Copied from CRM [id number]. Topic: General - Other >> Oct 17, 2024 12:11 PM Hadassah PARAS wrote: Reason for CRM: Reena from [facility] has received an order for skilled nursing and physical therapy and would like to know if PCP can sign off papers when discharged. Please avise [phone]. Please fax last visit note to [facility] at #[phone].   Please advise if pt has had a COVID vaccine and a A1C

## 2024-10-24 ENCOUNTER — Other Ambulatory Visit (HOSPITAL_BASED_OUTPATIENT_CLINIC_OR_DEPARTMENT_OTHER): Payer: Self-pay | Admitting: Student

## 2024-10-24 ENCOUNTER — Other Ambulatory Visit (HOSPITAL_BASED_OUTPATIENT_CLINIC_OR_DEPARTMENT_OTHER): Payer: Self-pay | Admitting: Family Medicine

## 2024-10-24 ENCOUNTER — Encounter (HOSPITAL_BASED_OUTPATIENT_CLINIC_OR_DEPARTMENT_OTHER): Payer: Self-pay | Admitting: Family Medicine

## 2024-10-24 ENCOUNTER — Ambulatory Visit (INDEPENDENT_AMBULATORY_CARE_PROVIDER_SITE_OTHER): Admitting: Family Medicine

## 2024-10-24 ENCOUNTER — Other Ambulatory Visit (HOSPITAL_BASED_OUTPATIENT_CLINIC_OR_DEPARTMENT_OTHER): Payer: Self-pay

## 2024-10-24 VITALS — BP 151/99 | HR 69 | Temp 97.6°F | Resp 16 | Wt 292.8 lb

## 2024-10-24 DIAGNOSIS — F5101 Primary insomnia: Secondary | ICD-10-CM

## 2024-10-24 DIAGNOSIS — E785 Hyperlipidemia, unspecified: Secondary | ICD-10-CM

## 2024-10-24 DIAGNOSIS — I10 Essential (primary) hypertension: Secondary | ICD-10-CM | POA: Diagnosis not present

## 2024-10-24 DIAGNOSIS — D510 Vitamin B12 deficiency anemia due to intrinsic factor deficiency: Secondary | ICD-10-CM

## 2024-10-24 DIAGNOSIS — F513 Sleepwalking [somnambulism]: Secondary | ICD-10-CM | POA: Diagnosis not present

## 2024-10-24 DIAGNOSIS — Z720 Tobacco use: Secondary | ICD-10-CM

## 2024-10-24 MED ORDER — QUETIAPINE FUMARATE 50 MG PO TABS
50.0000 mg | ORAL_TABLET | Freq: Every day | ORAL | 1 refills | Status: DC
  Filled 2024-10-24: qty 30, 15d supply, fill #0
  Filled 2024-11-20: qty 30, 15d supply, fill #1

## 2024-10-24 MED ORDER — CYANOCOBALAMIN 1000 MCG/ML IJ SOLN
1000.0000 ug | Freq: Once | INTRAMUSCULAR | Status: AC
  Administered 2024-10-24: 1000 ug via INTRAMUSCULAR

## 2024-10-24 MED ORDER — WEGOVY 0.25 MG/0.5ML ~~LOC~~ SOAJ
0.2500 mg | SUBCUTANEOUS | 0 refills | Status: DC
  Filled 2024-10-24: qty 2, 28d supply, fill #0

## 2024-10-24 NOTE — Progress Notes (Unsigned)
 Patient is in office today for a nurse visit for B12 Injection. Patient Injection was given in the  Right deltoid. Patient tolerated injection well.

## 2024-10-24 NOTE — Progress Notes (Unsigned)
 Established Patient Office Visit  Subjective   Patient ID: Julie [name], female    DOB: 10-10-1976  Age: 48 y.o. MRN: [id number]  Chief Complaint  Patient presents with   Hospitalization Follow-up    Hospital follow-up    F/u as above.  Recently hospitalized after a pretty bad fall and secondary injuries.  Her sleep walking is a problem and she was previously scheduled to see a sleep specialist at [facility].  She was unable to see them for unclear reasons, but does appear motivated to reschedule that visit soon.  If she can't see Neurology relatively soon, I advised her to see Dr. Mardee, as its imperative that her Sleep Walking, etc gets better controlled soon.  I'm pleased that she is tolerating her statin fine.  Recent discussion of Mammogram and Colonoscopy noted.  Ongoing struggles with weight loss.    Past Medical History:  Diagnosis Date   Chronic back pain    f/by pain clinic in [place]   Dyslipidemia    GERD (gastroesophageal reflux disease)    f/by GI in [place]   History of stomach ulcers    distant past, due to NSAID abuse   Hypertension    Hypothyroidism    IBS (irritable bowel syndrome)    Insomnia    Morbid obesity (HCC)    Osteoarthritis    Pernicious anemia    Sleep disorder    with frequent Sleep Walking, with specialist referral pending   Tobacco abuse    Unable to quit    Outpatient Encounter Medications as of 10/24/2024  Medication Sig   carvedilol  (COREG ) 12.5 MG tablet TAKE 1 TABLET BY MOUTH TWICE A DAY WITH A MEAL   Diclofenac  Potassium (CAMBIA ) 50 MG PACK Take 50 mg by mouth daily as needed (migraines). Mix in 4 oz water and drink   diphenoxylate -atropine  (LOMOTIL ) 2.5-0.025 MG tablet Take 1 tablet by mouth 4 (four) times daily as needed for diarrhea.   doxylamine , Sleep, (UNISOM ) 25 MG tablet Take 25 mg by mouth at bedtime as needed for sleep.    DULoxetine  (CYMBALTA ) 60 MG capsule Take 1 capsule (60 mg total) by mouth daily.    gabapentin  (NEURONTIN ) 600 MG tablet Take 1 tablet (600 mg total) by mouth 3 (three) times daily. (Patient taking differently: Take 1,800 mg by mouth daily.)   levalbuterol  (XOPENEX  HFA) 45 MCG/ACT inhaler Inhale 2 puffs into the lungs every 6 (six) hours as needed for up to 10 days for wheezing or shortness of breath.   levothyroxine  (SYNTHROID ) 137 MCG tablet Take 1 tablet (137 mcg total) by mouth in the morning.   oxyCODONE -acetaminophen  (PERCOCET) 7.5-325 MG tablet Take 1 tablet by mouth every 6 (six) hours.   pravastatin  (PRAVACHOL ) 20 MG tablet Take 1 tablet (20 mg total) by mouth daily.   semaglutide -weight management (WEGOVY ) 0.25 MG/0.5ML SOAJ SQ injection Inject 0.25 mg into the skin once a week.   tiZANidine  (ZANAFLEX ) 4 MG tablet Take 1 tablet (4 mg total) by mouth every 8 (eight) hours as needed.   traMADol (ULTRAM) 50 MG tablet Take 50 mg by mouth.   [DISCONTINUED] QUEtiapine (SEROQUEL) 50 MG tablet Take 1 tablet (50 mg total) by mouth at bedtime. You may increase up to 2 tablets (100 mg total) at bedtime.   QUEtiapine (SEROQUEL) 50 MG tablet Take 1 tablet (50 mg total) by mouth at bedtime. You may increase up to 2 tablets (100 mg total) at bedtime.   [DISCONTINUED] promethazine -dextromethorphan (PROMETHAZINE -DM) 6.25-15  MG/5ML syrup Take 5 mLs by mouth 4 (four) times daily as needed for cough. Do not use and drive - May make drowsy.   [EXPIRED] cyanocobalamin (VITAMIN B12) injection 1,000 mcg    No facility-administered encounter medications on file as of 10/24/2024.    Social History   Tobacco Use   Smoking status: Every Day    Current packs/day: 1.00    Average packs/day: 1 pack/day for 18.9 years (18.9 ttl pk-yrs)    Types: Cigarettes    Start date: 2007    Passive exposure: Never   Smokeless tobacco: Never  Vaping Use   Vaping status: Every Day  Substance Use Topics   Alcohol use: No   Drug use: No      Review of Systems  Constitutional:  Negative for  diaphoresis, fever, malaise/fatigue and weight loss.  Respiratory:  Negative for cough, shortness of breath and wheezing.   Cardiovascular:  Negative for chest pain, palpitations, orthopnea, claudication, leg swelling and PND.      Objective:     BP (!) 151/99 (Cuff Size: Large)   Pulse 69   Temp 97.6 F (36.4 C) (Oral)   Resp 16   Wt 292 lb 12.8 oz (132.8 kg)   SpO2 96%   BMI 48.78 kg/m    Physical Exam Constitutional:      General: She is not in acute distress.    Appearance: Normal appearance. She is obese.  HENT:     Head: Normocephalic.  Neck:     Vascular: No carotid bruit.  Cardiovascular:     Rate and Rhythm: Normal rate and regular rhythm.     Pulses: Normal pulses.     Heart sounds: Normal heart sounds.  Pulmonary:     Effort: Pulmonary effort is normal.     Breath sounds: Normal breath sounds.  Abdominal:     General: Bowel sounds are normal.     Palpations: Abdomen is soft.  Musculoskeletal:     Cervical back: Neck supple. No tenderness.     Right lower leg: No edema.     Left lower leg: No edema.  Neurological:     Mental Status: She is alert.      No results found for any visits on 10/24/24.    The 10-year ASCVD risk score (Arnett DK, et al., 2019) is: 5.1%    Assessment & Plan:  Essential hypertension Assessment & Plan: A bit elevated today.  Perhaps an anxiety component.  DASH diet.  Encouraged a nursing BP check here in the coming week.   Tobacco abuse Assessment & Plan: Again strongly discouraged.   OBESITY, MORBID Assessment & Plan: Clarify the cost of weight loss medication.  Orders: -     Wegovy ; Inject 0.25 mg into the skin once a week.  Dispense: 2 mL; Refill: 0  Sleep walking disorder Assessment & Plan: Uncontrolled.  Prompt f/u with sleep specialist.  I urged Dr. Mardee locally if she can't get in quickly somewhere else.   Dyslipidemia Assessment & Plan: Continue statin.   Pernicious anemia -      Cyanocobalamin  Primary insomnia -     QUEtiapine Fumarate; Take 1 tablet (50 mg total) by mouth at bedtime. You may increase up to 2 tablets (100 mg total) at bedtime.  Dispense: 30 tablet; Refill: 1    Return in about 3 months (around 01/24/2025) for chronic follow-up.    REDDING PONCE NORLEEN FALCON., MD

## 2024-10-25 ENCOUNTER — Other Ambulatory Visit (HOSPITAL_BASED_OUTPATIENT_CLINIC_OR_DEPARTMENT_OTHER): Payer: Self-pay

## 2024-10-25 NOTE — Assessment & Plan Note (Signed)
 Again strongly discouraged.

## 2024-10-25 NOTE — Assessment & Plan Note (Signed)
 Continue statin

## 2024-10-25 NOTE — Assessment & Plan Note (Signed)
 A bit elevated today.  Perhaps an anxiety component.  DASH diet.  Encouraged a nursing BP check here in the coming week.

## 2024-10-25 NOTE — Assessment & Plan Note (Signed)
 Clarify the cost of weight loss medication.

## 2024-10-25 NOTE — Assessment & Plan Note (Signed)
 Uncontrolled.  Prompt f/u with sleep specialist.  I urged Dr. Mardee locally if she can't get in quickly somewhere else.

## 2024-10-26 ENCOUNTER — Ambulatory Visit (INDEPENDENT_AMBULATORY_CARE_PROVIDER_SITE_OTHER): Admitting: Family Medicine

## 2024-10-26 ENCOUNTER — Other Ambulatory Visit (HOSPITAL_BASED_OUTPATIENT_CLINIC_OR_DEPARTMENT_OTHER): Payer: Self-pay

## 2024-10-26 VITALS — BP 148/97 | HR 67 | Temp 98.1°F | Resp 17 | Wt 294.2 lb

## 2024-10-26 DIAGNOSIS — F513 Sleepwalking [somnambulism]: Secondary | ICD-10-CM

## 2024-10-26 DIAGNOSIS — J329 Chronic sinusitis, unspecified: Secondary | ICD-10-CM

## 2024-10-26 DIAGNOSIS — J40 Bronchitis, not specified as acute or chronic: Secondary | ICD-10-CM | POA: Diagnosis not present

## 2024-10-26 DIAGNOSIS — R5383 Other fatigue: Secondary | ICD-10-CM

## 2024-10-26 DIAGNOSIS — R5381 Other malaise: Secondary | ICD-10-CM | POA: Diagnosis not present

## 2024-10-26 LAB — POC SOFIA 2 FLU + SARS ANTIGEN FIA
Influenza A, POC: NEGATIVE
Influenza B, POC: NEGATIVE
SARS Coronavirus 2 Ag: NEGATIVE

## 2024-10-26 NOTE — Progress Notes (Signed)
 Established Patient Office Visit  Subjective   Patient ID: Julie [name], female    DOB: 25-Mar-1976  Age: 48 y.o. MRN: [id number]  Chief Complaint  Patient presents with   feeling weak    Feeling weak    F/u as above.  Please see yesterday's note for details.  She has had one day of mild fatigue and malaise.  Mild nasal congestion and ST.  Symptoms are clearly mild.  No fever or other concerns.  States she slept pretty good last night and isn't currently feeling sleepy.    Past Medical History:  Diagnosis Date   Chronic back pain    f/by pain clinic in [place]   Dyslipidemia    GERD (gastroesophageal reflux disease)    f/by GI in [place]   History of stomach ulcers    distant past, due to NSAID abuse   Hypertension    Hypothyroidism    IBS (irritable bowel syndrome)    Insomnia    Morbid obesity (HCC)    Osteoarthritis    Pernicious anemia    Sleep disorder    with frequent Sleep Walking, with specialist referral pending   Tobacco abuse    Unable to quit    Outpatient Encounter Medications as of 10/26/2024  Medication Sig   carvedilol  (COREG ) 12.5 MG tablet TAKE 1 TABLET BY MOUTH TWICE A DAY WITH A MEAL   Diclofenac  Potassium (CAMBIA ) 50 MG PACK Take 50 mg by mouth daily as needed (migraines). Mix in 4 oz water and drink   diphenoxylate -atropine  (LOMOTIL ) 2.5-0.025 MG tablet Take 1 tablet by mouth 4 (four) times daily as needed for diarrhea.   doxylamine , Sleep, (UNISOM ) 25 MG tablet Take 25 mg by mouth at bedtime as needed for sleep.    DULoxetine  (CYMBALTA ) 60 MG capsule Take 1 capsule (60 mg total) by mouth daily.   gabapentin  (NEURONTIN ) 600 MG tablet Take 1 tablet (600 mg total) by mouth 3 (three) times daily.   levalbuterol  (XOPENEX  HFA) 45 MCG/ACT inhaler Inhale 2 puffs into the lungs every 6 (six) hours as needed for up to 10 days for wheezing or shortness of breath.   levothyroxine  (SYNTHROID ) 137 MCG tablet Take 1 tablet (137 mcg total) by mouth in  the morning.   oxyCODONE -acetaminophen  (PERCOCET) 7.5-325 MG tablet Take 1 tablet by mouth every 6 (six) hours.   pravastatin  (PRAVACHOL ) 20 MG tablet Take 1 tablet (20 mg total) by mouth daily.   QUEtiapine (SEROQUEL) 50 MG tablet Take 1 tablet (50 mg total) by mouth at bedtime. You may increase up to 2 tablets (100 mg total) at bedtime.   semaglutide -weight management (WEGOVY ) 0.25 MG/0.5ML SOAJ SQ injection Inject 0.25 mg into the skin once a week.   tiZANidine  (ZANAFLEX ) 4 MG tablet Take 1 tablet (4 mg total) by mouth every 8 (eight) hours as needed.   traMADol (ULTRAM) 50 MG tablet Take 50 mg by mouth.   No facility-administered encounter medications on file as of 10/26/2024.    Social History   Tobacco Use   Smoking status: Every Day    Current packs/day: 1.00    Average packs/day: 1 pack/day for 18.9 years (18.9 ttl pk-yrs)    Types: Cigarettes    Start date: 2007    Passive exposure: Never   Smokeless tobacco: Never  Vaping Use   Vaping status: Every Day  Substance Use Topics   Alcohol use: No   Drug use: No      ROS  Objective:     BP (!) 148/97 (Cuff Size: Large)   Pulse 67   Temp 98.1 F (36.7 C) (Oral)   Resp 17   Wt 294 lb 3.2 oz (133.4 kg)   SpO2 92%   BMI 49.02 kg/m    Physical Exam Constitutional:      General: She is not in acute distress.    Appearance: She is not ill-appearing, toxic-appearing or diaphoretic.     Comments: Very comfortable and benign appearing.  HENT:     Right Ear: Tympanic membrane normal.     Left Ear: Tympanic membrane normal.     Nose: Rhinorrhea present. No congestion.     Right Sinus: No maxillary sinus tenderness or frontal sinus tenderness.     Left Sinus: No maxillary sinus tenderness or frontal sinus tenderness.     Mouth/Throat:     Pharynx: No oropharyngeal exudate or posterior oropharyngeal erythema.  Eyes:     Conjunctiva/sclera: Conjunctivae normal.  Cardiovascular:     Rate and Rhythm: Normal rate  and regular rhythm.     Heart sounds: Normal heart sounds.  Pulmonary:     Breath sounds: Normal breath sounds.  Abdominal:     Palpations: Abdomen is soft.     Tenderness: There is no abdominal tenderness.  Musculoskeletal:     Cervical back: Normal range of motion and neck supple. No tenderness.  Lymphadenopathy:     Cervical: No cervical adenopathy.  Skin:    General: Skin is warm and dry.     Findings: No rash.  Neurological:     Mental Status: She is alert.      Results for orders placed or performed in visit on 10/26/24  POC SOFIA 2 FLU + SARS ANTIGEN FIA  Result Value Ref Range   Influenza A, POC Negative Negative   Influenza B, POC Negative Negative   SARS Coronavirus 2 Ag Negative Negative      The 10-year ASCVD risk score (Arnett DK, et al., 2019) is: 4.9%    Assessment & Plan:  Sinobronchitis Assessment & Plan: Likely viral.  Symptomatic rx.  F/u prn.  Orders: -     POC SOFIA 2 FLU + SARS ANTIGEN FIA  Malaise and fatigue  Sleep walking disorder Assessment & Plan: Again urged to get her appt with the sleep specialist of her choice.     No follow-ups on file.    REDDING PONCE NORLEEN FALCON., MD

## 2024-10-26 NOTE — Assessment & Plan Note (Signed)
 Likely viral.  Symptomatic rx.  F/u prn.

## 2024-10-26 NOTE — Assessment & Plan Note (Signed)
 Again urged to get her appt with the sleep specialist of her choice.

## 2024-10-27 ENCOUNTER — Other Ambulatory Visit (HOSPITAL_BASED_OUTPATIENT_CLINIC_OR_DEPARTMENT_OTHER): Payer: Self-pay | Admitting: Family Medicine

## 2024-10-27 ENCOUNTER — Other Ambulatory Visit (HOSPITAL_BASED_OUTPATIENT_CLINIC_OR_DEPARTMENT_OTHER): Payer: Self-pay

## 2024-10-27 ENCOUNTER — Ambulatory Visit: Payer: Self-pay

## 2024-10-27 DIAGNOSIS — J329 Chronic sinusitis, unspecified: Secondary | ICD-10-CM

## 2024-10-27 MED ORDER — DOXYCYCLINE HYCLATE 100 MG PO TABS
100.0000 mg | ORAL_TABLET | Freq: Two times a day (BID) | ORAL | 0 refills | Status: DC
  Filled 2024-10-27: qty 20, 10d supply, fill #0

## 2024-10-27 NOTE — Telephone Encounter (Signed)
 FYI Only or Action Required?: Action required by provider: pt requesting medication.  Patient was last seen in primary care on 10/26/2024 by Dottie Norleen PHEBE PONCE, MD.  Called Nurse Triage reporting Nasal Congestion.  Symptoms began several days ago.  Interventions attempted: OTC medications:  SABRA  Symptoms are: gradually worsening.  Triage Disposition: Home Care  Patient/caregiver understands and will follow disposition?: No, wishes to speak with PCP     Copied from CRM #[id number]. Topic: Clinical - Medical Advice >> Oct 27, 2024  9:28 AM Ahlexyia S wrote: Reason for CRM: Pt called in stating that she was told by provider that if she starts to feel worse than what she was previously then to let clinic know so provider can get something sent to pharmacy for pt. Pt stated she is having nose drainage, sore throat, stuffy nose and body aches. Pt also mentioned that she is cold so she isnt sure if she is running a low grade fever. Pt would like a call back when or if something is prescribed for her. Informed pt of turnaround time. Pt stated that if something is prescribed for her she would like for it to go to [facility] (pharmacy is listed on pt chart). Reason for Disposition  Common cold with no complications  Answer Assessment - Initial Assessment Questions Pt states that the doctor told her that if she felt worse he would send something in. She states she is worse today. She states that her nasal drainage and congestion is worse. She state the fatigue is worse also. Denies a fever but states that she can't regulate her body temperature, feels like she's chilling. She states she does have a cough but it is not productive and her sore throat is worse.    1. ONSET: When did the nasal discharge start?      2 days ago.  2. AMOUNT: How much discharge is there?      It is all doing down her throat 3. COUGH: Do you have a cough? If Yes, ask: Describe the color of your  mucus. (e.g., clear, white, yellow, green)     Yes- not productive 4. RESPIRATORY DISTRESS: Describe your breathing.      Denies chest pain or shortness of breath 5. FEVER: Do you have a fever? If Yes, ask: What is your temperature, how was it measured, and when did it start?     Denies but states she feels like she is chilling.  6. SEVERITY: Overall, how bad are you feeling right now? (e.g., doesn't interfere with normal activities, staying home from school/work, staying in bed)      She said much worse than yesterday.  7. OTHER SYMPTOMS: Do you have any other symptoms? (e.g., earache, mouth sores, sore throat, wheezing)     Sore throat  Protocols used: Common Cold-A-AH

## 2024-10-30 ENCOUNTER — Other Ambulatory Visit (HOSPITAL_BASED_OUTPATIENT_CLINIC_OR_DEPARTMENT_OTHER): Payer: Self-pay

## 2024-10-31 ENCOUNTER — Other Ambulatory Visit (HOSPITAL_BASED_OUTPATIENT_CLINIC_OR_DEPARTMENT_OTHER): Payer: Self-pay

## 2024-11-01 ENCOUNTER — Other Ambulatory Visit (HOSPITAL_COMMUNITY): Payer: Self-pay

## 2024-11-01 ENCOUNTER — Other Ambulatory Visit (HOSPITAL_BASED_OUTPATIENT_CLINIC_OR_DEPARTMENT_OTHER): Payer: Self-pay

## 2024-11-01 ENCOUNTER — Telehealth (HOSPITAL_BASED_OUTPATIENT_CLINIC_OR_DEPARTMENT_OTHER): Payer: Self-pay | Admitting: Pharmacy Technician

## 2024-11-01 NOTE — Telephone Encounter (Signed)
 Pharmacy Patient Advocate Encounter   Received notification from Onbase that prior authorization for Wegovy  is required/requested.   Insurance verification completed.   The patient is insured through [facility] MEDICAID.   Effective October 1st, Medicaid discontinued coverage of GLP1 medications for weight loss (such as Wegovy  and Zepbound), unless the patient has a documented history of a heart attack or stroke and a BMI greater than 30. Zepbound will continue to be covered only for patients with moderate to severe sleep apnea (AHI 15-30) and a BMI greater than 30.   CMM Key# [id number]

## 2024-11-10 ENCOUNTER — Other Ambulatory Visit (HOSPITAL_BASED_OUTPATIENT_CLINIC_OR_DEPARTMENT_OTHER): Payer: Self-pay

## 2024-11-16 ENCOUNTER — Other Ambulatory Visit (HOSPITAL_BASED_OUTPATIENT_CLINIC_OR_DEPARTMENT_OTHER): Payer: Self-pay

## 2024-11-17 ENCOUNTER — Encounter (HOSPITAL_BASED_OUTPATIENT_CLINIC_OR_DEPARTMENT_OTHER): Admitting: Family Medicine

## 2024-11-20 ENCOUNTER — Other Ambulatory Visit (HOSPITAL_BASED_OUTPATIENT_CLINIC_OR_DEPARTMENT_OTHER): Payer: Self-pay

## 2024-11-20 ENCOUNTER — Other Ambulatory Visit (HOSPITAL_BASED_OUTPATIENT_CLINIC_OR_DEPARTMENT_OTHER): Payer: Self-pay | Admitting: Family Medicine

## 2024-11-20 DIAGNOSIS — E039 Hypothyroidism, unspecified: Secondary | ICD-10-CM

## 2024-11-21 ENCOUNTER — Other Ambulatory Visit (HOSPITAL_BASED_OUTPATIENT_CLINIC_OR_DEPARTMENT_OTHER): Payer: Self-pay

## 2024-11-21 ENCOUNTER — Other Ambulatory Visit (HOSPITAL_BASED_OUTPATIENT_CLINIC_OR_DEPARTMENT_OTHER): Payer: Self-pay | Admitting: Family Medicine

## 2024-11-21 DIAGNOSIS — E039 Hypothyroidism, unspecified: Secondary | ICD-10-CM

## 2024-11-21 MED ORDER — LEVOTHYROXINE SODIUM 137 MCG PO TABS
137.0000 ug | ORAL_TABLET | Freq: Every morning | ORAL | 2 refills | Status: AC
  Filled 2024-11-21: qty 90, 90d supply, fill #0

## 2024-11-24 ENCOUNTER — Other Ambulatory Visit (HOSPITAL_BASED_OUTPATIENT_CLINIC_OR_DEPARTMENT_OTHER): Payer: Self-pay

## 2024-12-11 ENCOUNTER — Ambulatory Visit (INDEPENDENT_AMBULATORY_CARE_PROVIDER_SITE_OTHER)
Admission: RE | Admit: 2024-12-11 | Discharge: 2024-12-11 | Disposition: A | Discharge status: Home / Self Care | Source: Ambulatory Visit | Attending: Student | Admitting: Student

## 2024-12-11 ENCOUNTER — Telehealth (HOSPITAL_BASED_OUTPATIENT_CLINIC_OR_DEPARTMENT_OTHER): Payer: Self-pay | Admitting: Pharmacy Technician

## 2024-12-11 ENCOUNTER — Ambulatory Visit: Payer: Self-pay

## 2024-12-11 ENCOUNTER — Ambulatory Visit (INDEPENDENT_AMBULATORY_CARE_PROVIDER_SITE_OTHER): Admitting: Student

## 2024-12-11 ENCOUNTER — Other Ambulatory Visit (HOSPITAL_BASED_OUTPATIENT_CLINIC_OR_DEPARTMENT_OTHER): Payer: Self-pay | Admitting: *Deleted

## 2024-12-11 ENCOUNTER — Other Ambulatory Visit (HOSPITAL_BASED_OUTPATIENT_CLINIC_OR_DEPARTMENT_OTHER): Payer: Self-pay

## 2024-12-11 ENCOUNTER — Encounter (HOSPITAL_BASED_OUTPATIENT_CLINIC_OR_DEPARTMENT_OTHER): Payer: Self-pay | Admitting: Student

## 2024-12-11 VITALS — BP 116/83 | HR 85 | Temp 98.4°F | Resp 16 | Ht 64.96 in | Wt 292.7 lb

## 2024-12-11 DIAGNOSIS — U071 COVID-19: Secondary | ICD-10-CM

## 2024-12-11 DIAGNOSIS — J1282 Pneumonia due to coronavirus disease 2019: Secondary | ICD-10-CM

## 2024-12-11 DIAGNOSIS — R062 Wheezing: Secondary | ICD-10-CM | POA: Diagnosis not present

## 2024-12-11 DIAGNOSIS — R0989 Other specified symptoms and signs involving the circulatory and respiratory systems: Secondary | ICD-10-CM

## 2024-12-11 LAB — POC SOFIA 2 FLU + SARS ANTIGEN FIA
Influenza A, POC: NEGATIVE
Influenza B, POC: NEGATIVE
SARS Coronavirus 2 Ag: POSITIVE — AB

## 2024-12-11 MED ORDER — ALBUTEROL SULFATE HFA 108 (90 BASE) MCG/ACT IN AERS
2.0000 | INHALATION_SPRAY | Freq: Four times a day (QID) | RESPIRATORY_TRACT | 0 refills | Status: AC | PRN
  Filled 2024-12-11: qty 6.7, 25d supply, fill #0

## 2024-12-11 MED ORDER — WEGOVY 0.25 MG/0.5ML ~~LOC~~ SOAJ
0.2500 mg | SUBCUTANEOUS | 0 refills | Status: AC
  Filled 2024-12-11: qty 2, 28d supply, fill #0

## 2024-12-11 MED ORDER — NIRMATRELVIR/RITONAVIR (PAXLOVID)TABLET
3.0000 | ORAL_TABLET | Freq: Two times a day (BID) | ORAL | 0 refills | Status: AC
  Filled 2024-12-11: qty 30, 5d supply, fill #0

## 2024-12-11 NOTE — Telephone Encounter (Signed)
 Medication: Wegovy  0.25mg  Able to fill? No Prior authorization required? Yes Co-pay before assistance: n/a

## 2024-12-11 NOTE — Telephone Encounter (Signed)
 FYI Only or Action Required?: FYI only for provider: appointment scheduled on 12/11/24.  Patient was last seen in primary care on 10/26/2024 by Dottie Norleen PHEBE PONCE, MD.  Called Nurse Triage reporting Cough.  Symptoms began several days ago.  Interventions attempted: OTC medications: Tylenol  and Prescription medications: doxycycline  (old prescription).  Symptoms are: stable.  Triage Disposition: See Physician Within 24 Hours  Patient/caregiver understands and will follow disposition?: Yes   Copied from CRM [id number]. Topic: Clinical - Red Word Triage >> Dec 11, 2024 10:35 AM Victoria A wrote: [facility] that prompted transfer to Nurse Triage: Patient is having SOB especially when coughing, low grade fever, headaches for two days Reason for Disposition  SEVERE coughing spells (e.g., whooping sound after coughing, vomiting after coughing)  Answer Assessment - Initial Assessment Questions Patient called with coughing w/green mucus since Saturday, chills, SOB w/coughing, wheezing that started today. Denies SOB other than with a coughing fit. No thermometer to check temp. She says she had left over doxycycline  that she took starting on Saturday and the green mucus subsided, now a dry cough. Parents tested positive for Flu on Saturday and she was with them on Friday. Advised no availability with PCP today or tomorrow, scheduled visit today with Lang, she verbalized understanding.    1. ONSET: When did the cough begin?      Saturday  2. SEVERITY: How bad is the cough today?      Worse as the day goes on, 5/10  3. SPUTUM: Describe the color of your sputum (e.g., none, dry cough; clear, white, yellow, green)     Was green, now nothing is coming up  4. HEMOPTYSIS: Are you coughing up any blood? If Yes, ask: How much? (e.g., flecks, streaks, tablespoons, etc.)     No  5. DIFFICULTY BREATHING: Are you having difficulty breathing? If Yes, ask: How bad is it? (e.g., mild, moderate,  severe)      Moderate w/coughing, after the cough subsides, no SOB  6. FEVER: Do you have a fever? If Yes, ask: What is your temperature, how was it measured, and when did it start?     No thermometer, having chills  7. CARDIAC HISTORY: Do you have any history of heart disease? (e.g., heart attack, congestive heart failure)      No  8. LUNG HISTORY: Do you have any history of lung disease?  (e.g., pulmonary embolus, asthma, emphysema)     No  9. PE RISK FACTORS: Do you have a history of blood clots? (or: recent major surgery, recent prolonged travel, bedridden)     No  10. OTHER SYMPTOMS: Do you have any other symptoms? (e.g., runny nose, wheezing, chest pain)       Headaches, wheezing, stuffy nose  11. PREGNANCY: Is there any chance you are pregnant? When was your last menstrual period?       No  12. TRAVEL: Have you traveled out of the country in the last month? (e.g., travel history, exposures)       Parents positive for flu on Saturday, around them on Friday morning  Protocols used: Cough - Acute Non-Productive-A-AH

## 2024-12-11 NOTE — Progress Notes (Unsigned)
 "  Acute Office Visit  Subjective:     Patient ID: Julie [name], female    DOB: 1976-11-23, 49 y.o.   MRN: [id number]  Chief Complaint  Patient presents with   URI    SOB especially when coughing, low grade fever, headaches for two days. Coughing w/green mucus since Saturday, chills, SOB w/coughing, wheezing that started today. Denies SOB other than with a coughing fit. No thermometer to check temp. She says she had left over doxycycline  that she took starting on Saturday and the green mucus subsided, now a dry cough. Parents tested positive for Flu on Saturday and she was with them on Friday. No sore throat. Right side of rib hurts. Radiates from back to front.    Discussed the use of AI scribe software for clinical note transcription with the patient, who gave verbal consent to proceed.  History of Present Illness   Julie [name] is a 49 year old female with morbid obesity, obstructive sleep apnea, hypertension, and hyperthyroidism who presents with shortness of breath and cough.  She has been experiencing shortness of breath during coughing fits, a low-grade fever, and headaches for the past two days. The cough began on Saturday, producing green mucus, and was accompanied by chills and wheezing that started today. She does not have a thermometer at home to check her temperature.  She took leftover doxycycline  starting on Saturday, which she believes helped reduce the green mucus, leaving her with a dry cough. Her parents tested positive for flu on Saturday, and she was with them on Friday. No sore throat. She reports right-sided rib pain, likely from coughing, which radiates from back to front.  She has a history of morbid obesity, obstructive sleep apnea, hypertension, hyperthyroidism, and tobacco use. No history of asthma or COPD, although her mother has asthma. She has previously been prescribed Seroquel , which she finds helpful for sleep.  She describes temperature regulation  issues, alternating between feeling cold and experiencing intense heat. She has been taking Tylenol  to manage these symptoms. She has had COVID twice before, noting that symptoms vary each time.      ROS Per HPI     Objective:    BP 116/83   Pulse 85   Temp 98.4 F (36.9 C) (Oral)   Resp 16   Ht 5' 4.96 (1.65 m)   Wt 292 lb 11.2 oz (132.8 kg)   SpO2 92%   BMI 48.77 kg/m  {Vitals History (Optional):23777}  Physical Exam Constitutional:      General: She is not in acute distress.    Appearance: Normal appearance. She is not ill-appearing.  HENT:     Head: Normocephalic and atraumatic.     Nose: Nose normal.  Eyes:     General: No scleral icterus.    Conjunctiva/sclera: Conjunctivae normal.  Cardiovascular:     Rate and Rhythm: Normal rate and regular rhythm.     Heart sounds: Normal heart sounds. No murmur heard.    No friction rub.  Pulmonary:     Effort: Pulmonary effort is normal. No respiratory distress.     Breath sounds: Wheezing and rhonchi present. No rales.  Musculoskeletal:        General: Normal range of motion.  Skin:    General: Skin is warm and dry.     Coloration: Skin is not jaundiced or pale.  Neurological:     General: No focal deficit present.     Mental Status: She is alert.  Psychiatric:  Mood and Affect: Mood normal.        Behavior: Behavior normal.     Results for orders placed or performed in visit on 12/11/24  POC SOFIA 2 FLU + SARS ANTIGEN FIA  Result Value Ref Range   Influenza A, POC Negative Negative   Influenza B, POC Negative Negative   SARS Coronavirus 2 Ag Positive (A) Negative        Assessment & Plan:   Assessment and Plan    COVID-19 infection- pneumonia Confirmed COVID-19 infection with symptoms of cough, shortness of breath during coughing fits, wheezing, and temperature regulation issues. Negative for flu A and B. Oxygen saturation at 96%. No asthma or COPD. Recent exposure to flu-positive family members.  Previous use of doxycycline  for green mucus, now resolved. Paxlovid  therapy considered appropriate due to comorbidities and timeline of symptoms. Discussed potential side effect of Paxlovid  being unpalatable. Albuterol  inhaler offered to address wheezing and bronchi sounds. Discussed potential interaction of Paxlovid  with oxycodone  and Seroquel , with advice to adjust oxycodone  dosage if needed. - Prescribed Paxlovid  300 mg with 100 mg, two tablets twice daily for five days. - Ordered chest X-ray to assess lung condition.- UPDATE: Confirmed covid pneumonia, patient advised to go to ED if there is worsening in shortness of breath or o2 sat is < 94% consistently.  - Prescribed albuterol  inhaler to address wheezing and bronchi sounds. - Advised to monitor oxygen saturation at home and return if consistently in the low 90s. - Instructed to follow up if symptoms worsen or if experiencing significant shortness of breath outside of coughing fits. - Advised to adjust oxycodone  dosage to once daily if taking twice daily over the next five days.      Return if symptoms worsen or fail to improve.  Nicolus Ose T Sallee Hogrefe, PA-C  "

## 2024-12-11 NOTE — Patient Instructions (Addendum)
 It was nice to see you today!  Please make sure to take in plenty of fluids. You may try mucinex over the counter to help you cough up the mucous. I would recommend that you keep an eye on your oxygen saturation at home and if it is dropping into the low 90s, please make sure to get seen by myself, Dr. Dottie, Urgent care, or the ER.  If you have any problems before your next visit feel free to message me via MyChart (minor issues or questions) or call the office, otherwise you may reach out to schedule an office visit.  Thank you! Deyona Soza, PA-C

## 2024-12-12 ENCOUNTER — Other Ambulatory Visit (HOSPITAL_COMMUNITY): Payer: Self-pay

## 2024-12-12 ENCOUNTER — Telehealth (HOSPITAL_BASED_OUTPATIENT_CLINIC_OR_DEPARTMENT_OTHER): Payer: Self-pay

## 2024-12-12 ENCOUNTER — Ambulatory Visit (HOSPITAL_BASED_OUTPATIENT_CLINIC_OR_DEPARTMENT_OTHER): Payer: Self-pay | Admitting: Student

## 2024-12-12 NOTE — Telephone Encounter (Signed)
 Pharmacy Patient Advocate Encounter   Received notification from Pt Calls Messages that prior authorization for Wegovy  0.25 mg/0.5 ml auto injectors is required/requested.   Insurance verification completed.   The patient is insured through [facility] MEDICA[employer].   Per test claim: Per test claim, medication is not covered due to plan/benefit exclusion, PA not submitted at this time     *not covered for weight loss

## 2024-12-12 NOTE — Telephone Encounter (Signed)
 PA request has been Received. New Encounter has been or will be created for follow up. For additional info see Pharmacy Prior Auth telephone encounter from 12/12/24.

## 2024-12-13 ENCOUNTER — Other Ambulatory Visit (HOSPITAL_BASED_OUTPATIENT_CLINIC_OR_DEPARTMENT_OTHER): Payer: Self-pay | Admitting: Student

## 2024-12-13 ENCOUNTER — Other Ambulatory Visit (HOSPITAL_BASED_OUTPATIENT_CLINIC_OR_DEPARTMENT_OTHER): Payer: Self-pay

## 2024-12-13 ENCOUNTER — Other Ambulatory Visit (HOSPITAL_BASED_OUTPATIENT_CLINIC_OR_DEPARTMENT_OTHER): Payer: Self-pay | Admitting: Family Medicine

## 2024-12-13 DIAGNOSIS — F4321 Adjustment disorder with depressed mood: Secondary | ICD-10-CM

## 2024-12-13 DIAGNOSIS — F324 Major depressive disorder, single episode, in partial remission: Secondary | ICD-10-CM

## 2024-12-13 DIAGNOSIS — F5101 Primary insomnia: Secondary | ICD-10-CM

## 2024-12-14 ENCOUNTER — Other Ambulatory Visit: Payer: Self-pay

## 2024-12-14 ENCOUNTER — Other Ambulatory Visit (HOSPITAL_BASED_OUTPATIENT_CLINIC_OR_DEPARTMENT_OTHER): Payer: Self-pay

## 2024-12-14 ENCOUNTER — Telehealth (HOSPITAL_BASED_OUTPATIENT_CLINIC_OR_DEPARTMENT_OTHER): Payer: Self-pay | Admitting: *Deleted

## 2024-12-14 MED ORDER — DULOXETINE HCL 60 MG PO CPEP
60.0000 mg | ORAL_CAPSULE | Freq: Every day | ORAL | 1 refills | Status: AC
  Filled 2024-12-14 – 2024-12-18 (×2): qty 90, 90d supply, fill #0

## 2024-12-14 NOTE — Telephone Encounter (Signed)
 Voicemail full

## 2024-12-18 ENCOUNTER — Other Ambulatory Visit (HOSPITAL_BASED_OUTPATIENT_CLINIC_OR_DEPARTMENT_OTHER): Payer: Self-pay

## 2024-12-19 ENCOUNTER — Other Ambulatory Visit (HOSPITAL_BASED_OUTPATIENT_CLINIC_OR_DEPARTMENT_OTHER): Payer: Self-pay

## 2024-12-19 ENCOUNTER — Encounter (HOSPITAL_BASED_OUTPATIENT_CLINIC_OR_DEPARTMENT_OTHER): Payer: Self-pay | Admitting: Student

## 2024-12-19 ENCOUNTER — Ambulatory Visit (INDEPENDENT_AMBULATORY_CARE_PROVIDER_SITE_OTHER): Admitting: Student

## 2024-12-19 DIAGNOSIS — F5101 Primary insomnia: Secondary | ICD-10-CM | POA: Diagnosis not present

## 2024-12-19 DIAGNOSIS — R062 Wheezing: Secondary | ICD-10-CM

## 2024-12-19 MED ORDER — QUETIAPINE FUMARATE 50 MG PO TABS
50.0000 mg | ORAL_TABLET | Freq: Every day | ORAL | 1 refills | Status: AC
  Filled 2024-12-19: qty 30, 15d supply, fill #0

## 2024-12-19 MED ORDER — IPRATROPIUM-ALBUTEROL 0.5-2.5 (3) MG/3ML IN SOLN
3.0000 mL | Freq: Once | RESPIRATORY_TRACT | Status: AC
  Administered 2024-12-19: 3 mL via RESPIRATORY_TRACT

## 2024-12-19 NOTE — Addendum Note (Signed)
 Addended by: MIRIAM JOSEPH on: 12/19/2024 12:18 PM   Modules accepted: Orders

## 2024-12-19 NOTE — Patient Instructions (Addendum)
 It was nice to see you today!  As we discussed in clinic: - For your congestion I suggest mucinex over the counter to help break it up. If your cough gets too bad you can use the mucinex DM. - I suggest continuing to monitor your oxygen as you continue to heal up from this, it looked good today though! - Continue to use your albuterol  inhaler as needed.  If you have any problems before your next visit feel free to message me via MyChart (minor issues or questions) or call the office, otherwise you may reach out to schedule an office visit.  Thank you! Aquilla Voiles, PA-C

## 2024-12-19 NOTE — Progress Notes (Signed)
 "  Acute Office Visit  Subjective:     Patient ID: Julie [name], female    DOB: [birth year], 49 y.o.   MRN: [id number]  Chief Complaint  Patient presents with   Nasal Congestion    Having some chest congestion after having COVID last week.     HPI  Discussed the use of AI scribe software for clinical note transcription with the patient, who gave verbal consent to proceed.  History of Present Illness   Julie [name] is a 49 year old female who presents with persistent chest congestion following COVID-19 and pneumonia. She is accompanied by her mother.  She feels much better overall compared to a week and a half ago, following her recent COVID-19 and pneumonia. However, she continues to experience a persistent 'rattle' in her chest, though she is no longer coughing up phlegm. She has been using albuterol  as needed and completed a course of Paxlovid , which caused diarrhea for a few days.  She experiences some tenderness on her face, but overall, the congestion is not severe. She has not been using any specific treatments for congestion since completing Paxlovid  and albuterol . She reports having had fever, chills, and sweats recently, but not currently. She attributes previous symptoms to diarrhea affecting her gabapentin  absorption.  She mentions sleep disturbances, including sleepwalking, which she associates with not taking Seroquel . She describes an incident where she attempted to make tea with cat treats and croutons during a sleepwalking episode. She notes that when she was taking Seroquel , she did not experience these issues.  The cough does not keep her up at night as long as she uses albuterol  before bed. She has experienced consistent headaches with each COVID-19 infection, which she manages with Tylenol . She has lost eight pounds recently.      ROS Per HPI     Objective:    BP (!) 126/93   Pulse 80   Temp 98.4 F (36.9 C) (Oral)   Resp 16   Ht 5' 4.96 (1.65 m)    Wt 286 lb 12.8 oz (130.1 kg)   SpO2 96%   BMI 47.78 kg/m    Physical Exam Constitutional:      General: She is not in acute distress.    Appearance: Normal appearance. She is not ill-appearing.     Comments: Appears much improved today  HENT:     Head: Normocephalic and atraumatic.     Right Ear: Tympanic membrane, ear canal and external ear normal.     Left Ear: Tympanic membrane, ear canal and external ear normal.     Nose: Nose normal.  Eyes:     General: No scleral icterus.    Conjunctiva/sclera: Conjunctivae normal.  Cardiovascular:     Rate and Rhythm: Normal rate and regular rhythm.     Heart sounds: Normal heart sounds. No murmur heard.    No friction rub.  Pulmonary:     Effort: Pulmonary effort is normal. No respiratory distress.     Breath sounds: Wheezing (generalized) present. No rhonchi or rales.  Musculoskeletal:        General: Normal range of motion.  Skin:    General: Skin is warm and dry.     Coloration: Skin is not jaundiced or pale.  Neurological:     General: No focal deficit present.     Mental Status: She is alert.  Psychiatric:        Mood and Affect: Mood normal.  Behavior: Behavior normal.     No results found for any visits on 12/19/24.      Assessment & Plan:   Assessment and Plan    COVID-19 pneumonia Recent COVID-19 pneumonia. Symptoms have improved significantly, with residual wheezing and congestion. Oxygen saturation is 96%. No current cough or significant congestion. Diarrhea resolved after completing Paxlovid . No current fever, chills, or sweats. Headaches present but managed with Tylenol . - Continue albuterol  as needed. - Administered nebulizer treatment with duoneb- on reevaluation still wheezing but feeling more open - Recommended over-the-counter Mucinex to help clear congestion. - Encouraged increased fluid intake, including water and Pedialyte. - Advised against caffeine intake  Primary insomnia with  sleepwalking Insomnia with sleepwalking episodes. Sleepwalking resolved with Seroquel  but recurred after stopping due to diarrhea. Currently using over-the-counter medications for sleepwalking. - Refilled Seroquel  prescription. - Continue over-the-counter medications for sleepwalking as needed.       No orders of the defined types were placed in this encounter.   Return if symptoms worsen or fail to improve.  Ryelan Kazee T Arienne Gartin, PA-C  "

## 2025-01-11 ENCOUNTER — Other Ambulatory Visit (HOSPITAL_BASED_OUTPATIENT_CLINIC_OR_DEPARTMENT_OTHER): Payer: Self-pay

## 2025-01-25 ENCOUNTER — Ambulatory Visit (HOSPITAL_BASED_OUTPATIENT_CLINIC_OR_DEPARTMENT_OTHER): Admitting: Family Medicine
